# Patient Record
Sex: Male | Born: 1965 | Race: Black or African American | Hispanic: No | Marital: Single | Smoking: Never smoker
Health system: Southern US, Community
[De-identification: ages and names within clinical notes are randomized; demographics above are authoritative.]

## PROBLEM LIST (undated history)

## (undated) DIAGNOSIS — I10 Essential (primary) hypertension: Secondary | ICD-10-CM

## (undated) DIAGNOSIS — E119 Type 2 diabetes mellitus without complications: Secondary | ICD-10-CM

## (undated) DIAGNOSIS — B54 Unspecified malaria: Secondary | ICD-10-CM

## (undated) DIAGNOSIS — K4090 Unilateral inguinal hernia, without obstruction or gangrene, not specified as recurrent: Secondary | ICD-10-CM

## (undated) HISTORY — DX: Unilateral inguinal hernia, without obstruction or gangrene, not specified as recurrent: K40.90

## (undated) HISTORY — DX: Type 2 diabetes mellitus without complications: E11.9

## (undated) HISTORY — DX: Unspecified malaria: B54

---

## 2000-04-07 ENCOUNTER — Emergency Department (HOSPITAL_COMMUNITY): Admission: EM | Admit: 2000-04-07 | Discharge: 2000-04-07 | Payer: Self-pay | Admitting: *Deleted

## 2005-10-17 ENCOUNTER — Ambulatory Visit: Payer: Self-pay | Admitting: Internal Medicine

## 2005-10-17 ENCOUNTER — Ambulatory Visit (HOSPITAL_COMMUNITY): Admission: RE | Admit: 2005-10-17 | Discharge: 2005-10-17 | Payer: Self-pay | Admitting: Internal Medicine

## 2005-10-25 ENCOUNTER — Ambulatory Visit: Payer: Self-pay | Admitting: Internal Medicine

## 2005-11-01 ENCOUNTER — Ambulatory Visit: Payer: Self-pay | Admitting: Internal Medicine

## 2005-11-06 ENCOUNTER — Ambulatory Visit: Payer: Self-pay

## 2005-11-06 ENCOUNTER — Encounter: Payer: Self-pay | Admitting: Cardiology

## 2005-12-06 ENCOUNTER — Ambulatory Visit: Payer: Self-pay | Admitting: Internal Medicine

## 2005-12-11 ENCOUNTER — Ambulatory Visit: Payer: Self-pay | Admitting: Internal Medicine

## 2006-01-06 ENCOUNTER — Ambulatory Visit: Payer: Self-pay | Admitting: Internal Medicine

## 2006-04-10 ENCOUNTER — Ambulatory Visit: Payer: Self-pay | Admitting: Internal Medicine

## 2006-04-21 ENCOUNTER — Ambulatory Visit: Payer: Self-pay | Admitting: Internal Medicine

## 2006-05-12 ENCOUNTER — Ambulatory Visit: Payer: Self-pay | Admitting: Internal Medicine

## 2006-06-17 ENCOUNTER — Ambulatory Visit: Payer: Self-pay | Admitting: Internal Medicine

## 2006-09-05 ENCOUNTER — Ambulatory Visit: Payer: Self-pay | Admitting: Internal Medicine

## 2007-02-27 DIAGNOSIS — M545 Low back pain: Secondary | ICD-10-CM

## 2007-02-27 DIAGNOSIS — I10 Essential (primary) hypertension: Secondary | ICD-10-CM

## 2007-04-11 ENCOUNTER — Ambulatory Visit: Payer: Self-pay | Admitting: Family Medicine

## 2007-05-20 ENCOUNTER — Ambulatory Visit: Payer: Self-pay | Admitting: Internal Medicine

## 2007-07-13 ENCOUNTER — Ambulatory Visit: Payer: Self-pay | Admitting: Internal Medicine

## 2007-07-20 LAB — CONVERTED CEMR LAB
BUN: 13 mg/dL (ref 6–23)
CO2: 33 meq/L — ABNORMAL HIGH (ref 19–32)
Calcium: 9.5 mg/dL (ref 8.4–10.5)
Chloride: 100 meq/L (ref 96–112)
Creatinine, Ser: 1.1 mg/dL (ref 0.4–1.5)
GFR calc Af Amer: 96 mL/min
GFR calc non Af Amer: 79 mL/min
Glucose, Bld: 122 mg/dL — ABNORMAL HIGH (ref 70–99)
Potassium: 4.1 meq/L (ref 3.5–5.1)
Sodium: 137 meq/L (ref 135–145)

## 2007-09-24 ENCOUNTER — Ambulatory Visit: Payer: Self-pay | Admitting: Endocrinology

## 2007-09-24 DIAGNOSIS — M549 Dorsalgia, unspecified: Secondary | ICD-10-CM | POA: Insufficient documentation

## 2007-09-25 ENCOUNTER — Ambulatory Visit: Payer: Self-pay | Admitting: Endocrinology

## 2007-09-28 LAB — CONVERTED CEMR LAB: Sed Rate: 8 mm/hr (ref 0–20)

## 2007-11-12 ENCOUNTER — Encounter: Payer: Self-pay | Admitting: Internal Medicine

## 2008-02-15 ENCOUNTER — Ambulatory Visit: Payer: Self-pay | Admitting: Endocrinology

## 2008-02-15 DIAGNOSIS — R112 Nausea with vomiting, unspecified: Secondary | ICD-10-CM

## 2008-02-15 DIAGNOSIS — B9789 Other viral agents as the cause of diseases classified elsewhere: Secondary | ICD-10-CM | POA: Insufficient documentation

## 2008-02-15 DIAGNOSIS — R079 Chest pain, unspecified: Secondary | ICD-10-CM

## 2010-09-02 LAB — CONVERTED CEMR LAB
BUN: 13 mg/dL (ref 6–23)
Basophils Absolute: 0.1 10*3/uL (ref 0.0–0.1)
Basophils Relative: 0.7 % (ref 0.0–1.0)
CO2: 27 meq/L (ref 19–32)
Calcium: 8.7 mg/dL (ref 8.4–10.5)
Chloride: 103 meq/L (ref 96–112)
Creatinine, Ser: 1.1 mg/dL (ref 0.4–1.5)
Eosinophils Absolute: 0 10*3/uL (ref 0.0–0.7)
Eosinophils Relative: 0.2 % (ref 0.0–5.0)
GFR calc Af Amer: 96 mL/min
GFR calc non Af Amer: 79 mL/min
Glucose, Bld: 93 mg/dL (ref 70–99)
HCT: 43.7 % (ref 39.0–52.0)
Hemoglobin: 15.3 g/dL (ref 13.0–17.0)
Lymphocytes Relative: 13.5 % (ref 12.0–46.0)
MCHC: 34.9 g/dL (ref 30.0–36.0)
MCV: 92.2 fL (ref 78.0–100.0)
Monocytes Absolute: 0.5 10*3/uL (ref 0.1–1.0)
Monocytes Relative: 6.6 % (ref 3.0–12.0)
Neutro Abs: 6.2 10*3/uL (ref 1.4–7.7)
Neutrophils Relative %: 79 % — ABNORMAL HIGH (ref 43.0–77.0)
Platelets: 169 10*3/uL (ref 150–400)
Potassium: 3.4 meq/L — ABNORMAL LOW (ref 3.5–5.1)
RBC: 4.75 M/uL (ref 4.22–5.81)
RDW: 12.1 % (ref 11.5–14.6)
Sodium: 137 meq/L (ref 135–145)
WBC: 7.9 10*3/uL (ref 4.5–10.5)

## 2012-07-13 ENCOUNTER — Encounter (HOSPITAL_COMMUNITY): Payer: Self-pay | Admitting: *Deleted

## 2012-07-13 ENCOUNTER — Emergency Department (HOSPITAL_COMMUNITY): Payer: Managed Care, Other (non HMO)

## 2012-07-13 ENCOUNTER — Emergency Department (HOSPITAL_COMMUNITY)
Admission: EM | Admit: 2012-07-13 | Discharge: 2012-07-13 | Disposition: A | Discharge status: Home / Self Care | Payer: Managed Care, Other (non HMO) | Attending: Emergency Medicine | Admitting: Emergency Medicine

## 2012-07-13 DIAGNOSIS — I10 Essential (primary) hypertension: Secondary | ICD-10-CM

## 2012-07-13 HISTORY — DX: Essential (primary) hypertension: I10

## 2012-07-13 LAB — BASIC METABOLIC PANEL
CO2: 31 mEq/L (ref 19–32)
Calcium: 9.4 mg/dL (ref 8.4–10.5)
Glucose, Bld: 81 mg/dL (ref 70–99)
Sodium: 143 mEq/L (ref 135–145)

## 2012-07-13 LAB — URINE MICROSCOPIC-ADD ON

## 2012-07-13 LAB — POCT I-STAT TROPONIN I: Troponin i, poc: 0 ng/mL (ref 0.00–0.08)

## 2012-07-13 LAB — URINALYSIS, ROUTINE W REFLEX MICROSCOPIC
Glucose, UA: NEGATIVE mg/dL
Hgb urine dipstick: NEGATIVE
Protein, ur: 30 mg/dL — AB
Specific Gravity, Urine: 1.017 (ref 1.005–1.030)
pH: 7.5 (ref 5.0–8.0)

## 2012-07-13 MED ORDER — AMLODIPINE BESYLATE 5 MG PO TABS
5.0000 mg | ORAL_TABLET | Freq: Once | ORAL | Status: AC
  Administered 2012-07-13: 5 mg via ORAL
  Filled 2012-07-13: qty 1

## 2012-07-13 MED ORDER — LISINOPRIL 20 MG PO TABS: 20.0000 mg | ORAL_TABLET | Freq: Every day | ORAL | Status: DC

## 2012-07-13 MED ORDER — AMLODIPINE BESYLATE 5 MG PO TABS: 5.0000 mg | ORAL_TABLET | Freq: Every day | ORAL | Status: DC

## 2012-07-13 MED ORDER — LISINOPRIL 20 MG PO TABS
20.0000 mg | ORAL_TABLET | Freq: Once | ORAL | Status: AC
  Administered 2012-07-13: 20 mg via ORAL
  Filled 2012-07-13: qty 1

## 2012-07-13 NOTE — ED Notes (Signed)
From MD's office, seen for hypertension. Sent to ED for abnormal EKG

## 2012-07-13 NOTE — ED Provider Notes (Signed)
History     CSN: [id number]  Arrival date & time 07/13/12  1556   First MD Initiated Contact with Patient 07/13/12 1557      Chief Complaint  Patient presents with  . Hypertension    (Consider location/radiation/quality/duration/timing/severity/associated sxs/prior treatment) HPI Comments: Patient at his friend's house and his friend said, "You don't look good, you look pale." Patient presented to the urgent care for this. He denied any pain or malaise. EKG there with concern for ST elevation in V2/V3.   Patient is a 46 y.o. male presenting with general illness. The history is provided by the patient.  Illness  The current episode started today. The onset was sudden. The problem occurs continuously. The problem has been unchanged. The problem is mild. Nothing relieves the symptoms. Nothing aggravates the symptoms. Pertinent negatives include no fever, no abdominal pain, no vomiting and no cough.    Past Medical History  Diagnosis Date  . Hypertension     History reviewed. No pertinent past surgical history.  No family history on file.  History  Substance Use Topics  . Smoking status: Not on file  . Smokeless tobacco: Not on file  . Alcohol Use:       Review of Systems  Constitutional: Negative for fever and chills.  Respiratory: Negative for cough and shortness of breath.   Cardiovascular: Negative for chest pain and leg swelling.  Gastrointestinal: Negative for vomiting and abdominal pain.  All other systems reviewed and are negative.    Allergies  Chloroquine phosphate  Home Medications  No current outpatient prescriptions on file.  BP 171/105  Pulse 58  Temp 97.8 F (36.6 C) (Oral)  Resp 18  SpO2 100%  Physical Exam  Nursing note and vitals reviewed. Constitutional: He is oriented to person, place, and time. He appears well-developed and well-nourished. No distress.  HENT:  Head: Normocephalic and atraumatic.  Mouth/Throat: No oropharyngeal  exudate.  Eyes: EOM are normal. Pupils are equal, round, and reactive to light.  Neck: Normal range of motion. Neck supple.  Cardiovascular: Normal rate and regular rhythm.  Exam reveals no friction rub.   No murmur heard. Pulmonary/Chest: Effort normal and breath sounds normal. No respiratory distress. He has no wheezes. He has no rales.  Abdominal: He exhibits no distension. There is no tenderness. There is no rebound.  Musculoskeletal: Normal range of motion. He exhibits no edema.  Neurological: He is alert and oriented to person, place, and time.  Skin: Skin is dry. No rash noted. He is not diaphoretic.    ED Course  Procedures (including critical care time)  Labs Reviewed  BASIC METABOLIC PANEL - Abnormal; Notable for the following:    GFR calc non Af Amer 82 (*)     All other components within normal limits  URINALYSIS, ROUTINE W REFLEX MICROSCOPIC - Abnormal; Notable for the following:    Protein, ur 30 (*)     All other components within normal limits  POCT I-STAT TROPONIN I  URINE MICROSCOPIC-ADD ON   Dg Chest 2 View  07/13/2012  *RADIOLOGY REPORT*  Clinical Data: Hypertension, headache, dizziness, URI last week  CHEST - 2 VIEW  Comparison: 09/25/2007  Findings: Normal heart size, mediastinal contours, and pulmonary vascularity. Lungs clear. No pleural effusion or pneumothorax. Scattered endplate spur formation thoracic spine.  IMPRESSION: No acute abnormalities.   Original Report Authenticated By: Ulyses Southward, M.D.      1. Hypertension      Date: 07/13/2012  Rate: 56  Rhythm: normal sinus rhythm  QRS Axis: normal  Intervals: normal  ST/T Wave abnormalities: early repolarization  Conduction Disutrbances:none  Narrative Interpretation: NO STEMI. Concern for LVH.   Old EKG Reviewed: none available   MDM   Patient is a 46 year old male with history of hypertension and is noncompliant with medications. Patient was at his friend's house today and his friend noted that  he looked pale. He went to urgent care where an EKG showed possible ST elevation in V2 and V3. He was sent here for further evaluation. On arrival, patient is hypertensive at 185/121. He is mildly ready cardiac in the high 50s. He has no complaints of chest pain, shortness of breath, headache, blurry vision, neurologic symptoms. He states he feels very well today. On patient EKG is noted to have benign early repolarization that appears to look like ST elevation in V2 and V3. He doesn't have a reciprocal changes. I do not feel patient is having a STEMI. He is completely asymptomatic and feels well.l. I will check basic labs to see if he has any end organ damage from his prolonged hypertension. Labs show some mild proteinuria. He has normal renal function on his BMP. Chest x-ray is normal, reviewed by me. Patient is stable for discharge since his pressures are improving. His systolic has come down over 20 points and his diastolics come down over 15 points from the initial EMS pressure of 190/130. Will give prescription for Norvasc and lisinopril and give PCP followup.        Elwin Mocha, MD 07/13/12 [phone]

## 2012-07-14 NOTE — ED Provider Notes (Signed)
I saw and evaluated the patient, reviewed the resident's note and I agree with the findings and plan.  Sent from urgent care with hypertension and abnormal EKG.  Feels well. No headache, vision change, chest pain, SOB, nausea, vomiting. Noncompliant with BP meds. EKG with early repolarization and LVH. No evidence of end organ damage.  Glynn Octave, MD 07/14/12 1323

## 2012-07-16 ENCOUNTER — Ambulatory Visit (INDEPENDENT_AMBULATORY_CARE_PROVIDER_SITE_OTHER): Payer: Managed Care, Other (non HMO) | Admitting: Internal Medicine

## 2012-07-16 ENCOUNTER — Encounter: Payer: Self-pay | Admitting: Internal Medicine

## 2012-07-16 VITALS — BP 158/108 | HR 86 | Temp 98.8°F | Ht 66.5 in | Wt 188.0 lb

## 2012-07-16 DIAGNOSIS — B360 Pityriasis versicolor: Secondary | ICD-10-CM

## 2012-07-16 DIAGNOSIS — I10 Essential (primary) hypertension: Secondary | ICD-10-CM

## 2012-07-16 MED ORDER — FLUCONAZOLE 150 MG PO TABS: ORAL_TABLET | ORAL | Status: DC

## 2012-07-16 MED ORDER — LISINOPRIL 20 MG PO TABS: 20.0000 mg | ORAL_TABLET | Freq: Every day | ORAL | Status: DC

## 2012-07-16 MED ORDER — AMLODIPINE BESYLATE 5 MG PO TABS: 5.0000 mg | ORAL_TABLET | Freq: Every day | ORAL | Status: DC

## 2012-07-16 NOTE — Patient Instructions (Addendum)
Please complete the following lab tests before your next follow up appointment: BMET, FLP, FLTs, TSH - 401.9 Use Selson blue shampoo with Selenium sulfide as body wash daily as directed for 2-4 weeks.

## 2012-07-16 NOTE — Assessment & Plan Note (Signed)
Patient has tinea versicolor on chest,shoulders and upper back. Treat with fluconazole 300 mg once weekly for 2 weeks. Patient also advised to use [facility] as body wash for 2-4 weeks.

## 2012-07-16 NOTE — Progress Notes (Signed)
Subjective:    Patient ID: Devon Walsh, male    DOB: 1965-08-24, 46 y.o.   MRN: [id number]  HPI  46 year old African male for followup from ER visit. 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 Patient has history of hypertension but stopped taking medications in July of 2012. Patient reports previously he experienced headaches with elevated blood pressure but he was feeling fairly normal before ER visit. His friend noticed that "he did not look right". His friend urged patient to seek urgent medical attention. He was seen in urgent care and there was concern for EKG changes. Patient noted to have ST elevation in V2 and V3.  Patient was not experiencing any chest discomfort. His cardiac enzyme was normal. Chest x-ray was also normal.  He was restarted on lisinopril 20 mg once daily and amlodipine 5 mg once daily. It has been only 2-3 days since starting medication.  Patient also complains of diffuse rash that started the summer. He has flat hypopigmented areas on his chest shoulders and back.   Review of Systems  Constitutional: Negative for activity change, appetite change and unexpected weight change.  Eyes: Negative for visual disturbance.  Respiratory: Negative for cough, chest tightness and shortness of breath.   Cardiovascular: Negative for chest pain.  Genitourinary: Negative for difficulty urinating.  Neurological: Negative for headaches.  Gastrointestinal: Negative for abdominal pain, heartburn melena or hematochezia Psych: Negative for depression or anxiety  Past Medical History  Diagnosis Date  . Hypertension     History   Social History  . Marital Status: Single    Spouse Name: N/A    Number of Children: N/A  . Years of Education: N/A   Occupational History  . Not on file.   Social History Main Topics  . Smoking status: Never Smoker   . Smokeless tobacco: Never Used  . Alcohol Use: No  . Drug Use: No  . Sexually Active: Not on file   Other  Topics Concern  . Not on file   Social History Narrative  . No narrative on file    History reviewed. No pertinent past surgical history.  Family History  Problem Relation Age of Onset  . Hypertension      Allergies  Allergen Reactions  . Chloroquine Phosphate     REACTION: Itching    Current Outpatient Prescriptions on File Prior to Visit  Medication Sig Dispense Refill  . [DISCONTINUED] amLODipine (NORVASC) 5 MG tablet Take 1 tablet (5 mg total) by mouth daily.  30 tablet  0  . [DISCONTINUED] lisinopril (PRINIVIL,ZESTRIL) 20 MG tablet Take 1 tablet (20 mg total) by mouth daily.  30 tablet  0    BP 158/108  Pulse 86  Temp 98.8 F (37.1 C) (Oral)  Ht 5' 6.5" (1.689 m)  Wt 188 lb (85.276 kg)  BMI 29.89 kg/m2           Objective:   Physical Exam  Constitutional: He is oriented to person, place, and time. He appears well-developed and well-nourished.  HENT:  Head: Normocephalic and atraumatic.  Cardiovascular: Normal rate, regular rhythm and normal heart sounds.   Pulmonary/Chest: Effort normal and breath sounds normal. He has no wheezes.  Abdominal: Soft. Bowel sounds are normal. There is no tenderness.  Musculoskeletal: He exhibits no edema.  Neurological: He is alert and oriented to person, place, and time. No cranial nerve deficit.  Skin: Skin is warm and dry. Rash noted.       Scattered hypopigmented areas on back,  shoulders and chest  Psychiatric: He has a normal mood and affect. His behavior is normal.          Assessment & Plan:

## 2012-07-16 NOTE — Assessment & Plan Note (Signed)
46 year old African male with malignant hypertension. We discussed at length the risks of untreated hypertension including stroke and myocardial infarction.  I urged compliance with blood pressure medications. Continue lisinopril 20 mg and amlodipine 5 mg for now. It is too early to make dosage adjustments. Reassess patient in one month. Obtain electrolytes and kidney function before next office visit. Patient warned against using over-the-counter NSAIDs with lisinopril. Patient to use Tylenol for headaches.  Obtain screening lipid panel for additional risk stratification.  BP: 158/108 mmHg

## 2012-11-21 ENCOUNTER — Other Ambulatory Visit: Payer: Self-pay | Admitting: Internal Medicine

## 2013-03-01 ENCOUNTER — Other Ambulatory Visit: Payer: Self-pay | Admitting: Internal Medicine

## 2013-03-30 ENCOUNTER — Encounter: Payer: Self-pay | Admitting: Internal Medicine

## 2013-03-30 ENCOUNTER — Ambulatory Visit (INDEPENDENT_AMBULATORY_CARE_PROVIDER_SITE_OTHER): Payer: Managed Care, Other (non HMO) | Admitting: Internal Medicine

## 2013-03-30 VITALS — BP 162/90 | HR 72 | Temp 98.0°F | Wt 190.0 lb

## 2013-03-30 DIAGNOSIS — I10 Essential (primary) hypertension: Secondary | ICD-10-CM

## 2013-03-30 DIAGNOSIS — B360 Pityriasis versicolor: Secondary | ICD-10-CM

## 2013-03-30 DIAGNOSIS — R358 Other polyuria: Secondary | ICD-10-CM

## 2013-03-30 DIAGNOSIS — R3589 Other polyuria: Secondary | ICD-10-CM | POA: Insufficient documentation

## 2013-03-30 LAB — POCT URINALYSIS DIPSTICK
Blood, UA: NEGATIVE
Glucose, UA: NEGATIVE
Nitrite, UA: NEGATIVE
Spec Grav, UA: 1.02
Urobilinogen, UA: 0.2

## 2013-03-30 MED ORDER — LISINOPRIL 20 MG PO TABS: 20.0000 mg | ORAL_TABLET | Freq: Every day | ORAL | Status: DC

## 2013-03-30 MED ORDER — AMLODIPINE BESYLATE 10 MG PO TABS: 10.0000 mg | ORAL_TABLET | Freq: Every day | ORAL | Status: DC

## 2013-03-30 NOTE — Assessment & Plan Note (Addendum)
Patient has difficult to control hypertension despite using lisinopril 20 mg amlodipine 5 mg. Increase amlodipine to 10 mg. Patient trying to decrease his sodium intake. If persistent elevation, consider workup for secondary hypertension including renal arterial doppler. BP: 162/90 mmHg  Monitor electrolytes, kidney function, TSH, FLP and LFTs.

## 2013-03-30 NOTE — Patient Instructions (Addendum)
Use Selsun blue shampoo as body wash daily for 1 month

## 2013-03-30 NOTE — Assessment & Plan Note (Signed)
Patient complains of intermittent pruritus. He still has tinea versicolor. He's planning to travel back to his home country[place] within the next one month. He will need malaria prophylaxis. Use over-the-counter Selsun Blue as body wash. If persistent symptoms, we discussed using Diflucan.

## 2013-03-30 NOTE — Progress Notes (Signed)
  Subjective:    Patient ID: Devon Walsh, male    DOB: 1966/06/20, 47 y.o.   MRN: [id number]  HPI  47 year old African American male for followup regarding: Controlled hypertension. Patient reports she's been compliant with his antihypertensives. He is currently taking lisinopril 20 mg once daily and amlodipine 5 mg once daily. Despite taking blood pressure medications as readings at home are persistently in the 150s to 160s systolic.  Patient also describes intermittent polyuria. His symptoms are mainly in the morning. He also has 2-3 episodes of nocturia. He reports drinking 1 to 2 L in the evening.   Review of Systems No chest pain or shortness of breath.  Patient plays soccer recreationally without any difficulty    Past Medical History  Diagnosis Date  . Hypertension     History   Social History  . Marital Status: Single    Spouse Name: N/A    Number of Children: N/A  . Years of Education: N/A   Occupational History  . Not on file.   Social History Main Topics  . Smoking status: Never Smoker   . Smokeless tobacco: Never Used  . Alcohol Use: No  . Drug Use: No  . Sexual Activity: Not on file   Other Topics Concern  . Not on file   Social History Narrative  . No narrative on file    No past surgical history on file.  Family History  Problem Relation Age of Onset  . Hypertension      Allergies  Allergen Reactions  . Chloroquine Phosphate     REACTION: Itching    No current outpatient prescriptions on file prior to visit.   No current facility-administered medications on file prior to visit.    BP 162/90  Pulse 72  Temp(Src) 98 F (36.7 C) (Oral)  Wt 190 lb (86.183 kg)  BMI 30.21 kg/m2    Objective:   Physical Exam  Constitutional: He appears well-developed and well-nourished.  HENT:  Head: Normocephalic and atraumatic.  Eyes: EOM are normal. Pupils are equal, round, and reactive to light.  Neck: Neck supple.  Cardiovascular: Normal rate,  regular rhythm and normal heart sounds.   No murmur heard. Pulmonary/Chest: Effort normal and breath sounds normal. He has no wheezes.  Musculoskeletal: He exhibits no edema.  Lymphadenopathy:    He has no cervical adenopathy.  Neurological: He is alert. No cranial nerve deficit.  Skin: Skin is warm and dry.  Areas of hypopigmentation on back and upper arms  Psychiatric: He has a normal mood and affect. His behavior is normal.          Assessment & Plan:

## 2013-03-30 NOTE — Assessment & Plan Note (Signed)
I doubt symptoms secondary to hyperglycemia. He does not have family history of type 2 diabetes. Check A1c. Check urinalysis.

## 2013-03-31 LAB — CBC WITH DIFFERENTIAL/PLATELET
Basophils Relative: 0.6 % (ref 0.0–3.0)
Hemoglobin: 14.5 g/dL (ref 13.0–17.0)
Lymphocytes Relative: 40.9 % (ref 12.0–46.0)
Monocytes Relative: 6.2 % (ref 3.0–12.0)
Neutro Abs: 2.6 10*3/uL (ref 1.4–7.7)
Neutrophils Relative %: 51.5 % (ref 43.0–77.0)
RBC: 4.66 Mil/uL (ref 4.22–5.81)
WBC: 5.1 10*3/uL (ref 4.5–10.5)

## 2013-03-31 LAB — HEMOGLOBIN A1C: Hgb A1c MFr Bld: 6 % (ref 4.6–6.5)

## 2013-04-01 LAB — LIPID PANEL
Cholesterol: 146 mg/dL (ref 0–200)
HDL: 39.5 mg/dL (ref >39.00)
LDL Cholesterol: 86 mg/dL (ref 0–99)
Total CHOL/HDL Ratio: 4
Triglycerides: 101 mg/dL (ref 0.0–149.0)

## 2013-04-01 LAB — BASIC METABOLIC PANEL
Calcium: 9.3 mg/dL (ref 8.4–10.5)
GFR: 94.12 mL/min (ref >60.00)
Sodium: 138 mEq/L (ref 135–145)

## 2013-04-01 LAB — HEPATIC FUNCTION PANEL
AST: 26 U/L (ref 0–37)
Albumin: 4.2 g/dL (ref 3.5–5.2)
Total Protein: 7.5 g/dL (ref 6.0–8.3)

## 2013-04-19 ENCOUNTER — Encounter: Payer: Self-pay | Admitting: *Deleted

## 2013-04-19 ENCOUNTER — Telehealth: Payer: Self-pay | Admitting: *Deleted

## 2013-04-19 DIAGNOSIS — I10 Essential (primary) hypertension: Secondary | ICD-10-CM

## 2013-04-19 DIAGNOSIS — E785 Hyperlipidemia, unspecified: Secondary | ICD-10-CM

## 2013-04-19 DIAGNOSIS — R7309 Other abnormal glucose: Secondary | ICD-10-CM

## 2013-04-19 MED ORDER — PRAVASTATIN SODIUM 40 MG PO TABS: 40.0000 mg | ORAL_TABLET | Freq: Every day | ORAL | Status: DC

## 2013-04-19 NOTE — Telephone Encounter (Signed)
Lab results mailed to home address, rx sent to pharmacy, and labs ordered

## 2013-04-19 NOTE — Telephone Encounter (Signed)
Message copied by Trenton Gammon on Mon Apr 19, 2013  3:34 PM ------      Message from: Meda Coffee      Created: Fri Apr 09, 2013 10:31 AM       Call pt - electrolytes and kidney function stable.  CBCD and TSH normal.  A1c shows he is pre diabetic.  Although lipid panel is normal, his 10 year cardiovascular risk is elevated at 9.7%.  I suggest he add pravastatin 40 mg #90 one po qd RF x 1.  He needs repeat OV within 2 months.  Labs before OV - BMET - 401.9, FLP, LFTs - 272.4, A1c - 790.29.            He should follow low carb diet and avoid concentrated sweets ------

## 2013-04-22 ENCOUNTER — Encounter: Payer: Self-pay | Admitting: *Deleted

## 2013-05-17 ENCOUNTER — Encounter: Payer: Self-pay | Admitting: Internal Medicine

## 2013-05-17 ENCOUNTER — Ambulatory Visit (INDEPENDENT_AMBULATORY_CARE_PROVIDER_SITE_OTHER): Payer: Managed Care, Other (non HMO) | Admitting: Internal Medicine

## 2013-05-17 VITALS — BP 122/84 | Temp 98.1°F | Wt 183.0 lb

## 2013-05-17 DIAGNOSIS — I10 Essential (primary) hypertension: Secondary | ICD-10-CM

## 2013-05-17 DIAGNOSIS — M7521 Bicipital tendinitis, right shoulder: Secondary | ICD-10-CM | POA: Insufficient documentation

## 2013-05-17 DIAGNOSIS — E785 Hyperlipidemia, unspecified: Secondary | ICD-10-CM

## 2013-05-17 DIAGNOSIS — M7520 Bicipital tendinitis, unspecified shoulder: Secondary | ICD-10-CM

## 2013-05-17 MED ORDER — DICLOFENAC SODIUM 1 % TD GEL: 2.0000 g | Freq: Three times a day (TID) | TRANSDERMAL | Status: DC

## 2013-05-17 NOTE — Progress Notes (Signed)
  Subjective:    Patient ID: Devon Walsh, male    DOB: [birth year], 47 y.o.   MRN: [id number]  HPI  47 year old African male with history of hypertension and hyperlipidemia for followup. Patient reports good medication compliance. His home blood pressure readings have been normal. He is tolerating pravastatin without any side effects. He has improved his diet. He recently gave up his second job and plans to restart exercise program.  Patient complains of pain in his right bicep. 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  He denies any other trauma. He has not tried any over-the-counter analgesics.   Review of Systems Negative for chest pain, negative for dizziness    Past Medical History  Diagnosis Date  . Hypertension     History   Social History  . Marital Status: Single    Spouse Name: N/A    Number of Children: N/A  . Years of Education: N/A   Occupational History  . Not on file.   Social History Main Topics  . Smoking status: Never Smoker   . Smokeless tobacco: Never Used  . Alcohol Use: No  . Drug Use: No  . Sexual Activity: Not on file   Other Topics Concern  . Not on file   Social History Narrative  . No narrative on file    No past surgical history on file.  Family History  Problem Relation Age of Onset  . Hypertension      Allergies  Allergen Reactions  . Chloroquine Phosphate     REACTION: Itching    Current Outpatient Prescriptions on File Prior to Visit  Medication Sig Dispense Refill  . amLODipine (NORVASC) 10 MG tablet Take 1 tablet (10 mg total) by mouth daily.  90 tablet  1  . lisinopril (PRINIVIL,ZESTRIL) 20 MG tablet Take 1 tablet (20 mg total) by mouth daily.  90 tablet  1  . pravastatin (PRAVACHOL) 40 MG tablet Take 1 tablet (40 mg total) by mouth daily.  90 tablet  1   No current facility-administered medications on file prior to visit.    BP 122/84  Temp(Src) 98.1 F (36.7 C) (Oral)  Wt 183 lb (83.008 kg)  BMI 29.1  kg/m2    Objective:   Physical Exam  Constitutional: He is oriented to person, place, and time. He appears well-developed and well-nourished.  HENT:  Head: Normocephalic and atraumatic.  Neck: Neck supple.  No carotid bruit  Cardiovascular: Normal rate, regular rhythm and normal heart sounds.   No murmur heard. Pulmonary/Chest: Effort normal and breath sounds normal. He has no wheezes.  Musculoskeletal: He exhibits no edema.  Tenderness of right bicep tendon near elbow  Neurological: He is alert and oriented to person, place, and time. No cranial nerve deficit.  Psychiatric: He has a normal mood and affect. His behavior is normal.          Assessment & Plan:

## 2013-05-17 NOTE — Assessment & Plan Note (Signed)
Patient has symptoms of right bicep tendinitis. His symptoms secondary to recurrent lifting at work. Patient advised to avoid overuse. Use Voltaren gel 3 times a day as directed.  Also try using neoprene elbow brace.  Patient advised to call office if symptoms persist or worsen.  If no improvement in symptoms, he may need "light duty" for 1-2 weeks.

## 2013-05-17 NOTE — Patient Instructions (Signed)
Monitor your blood pressure at home Please complete the following lab tests before your next follow up appointment: BMET - 401.9 Apply ice, rest and use elbow neoprene brace as directed. Please contact our office if your right arm pain does not improve or gets worse.

## 2013-05-17 NOTE — Assessment & Plan Note (Signed)
Patient tolerating pravastatin. Continue same dose.

## 2013-05-17 NOTE — Assessment & Plan Note (Signed)
Good response to higher dose of amlodipine.  Continue same dose.  BP: 122/84 mmHg  Lab Results  Component Value Date   NA 138 03/30/2013   K 3.8 03/30/2013   CL 102 03/30/2013   CO2 29 03/30/2013   Lab Results  Component Value Date   CREATININE 1.1 03/30/2013   Continue pravastatin for CV prevention.   Lab Results  Component Value Date   CHOL 146 03/30/2013   HDL 39.50 03/30/2013   LDLCALC 86 03/30/2013   TRIG 101.0 03/30/2013   CHOLHDL 4 03/30/2013   Lab Results  Component Value Date   ALT 22 03/30/2013   AST 26 03/30/2013   ALKPHOS 86 03/30/2013   BILITOT 0.5 03/30/2013

## 2013-06-03 IMAGING — CR DG CHEST 2V
2 series · 2 of 2 positions shown · non-contrast
Comparison: 09/25/2007

CLINICAL DATA: Hypertension, headache, dizziness, URI last week

CHEST - 2 VIEW

[w chest pa]
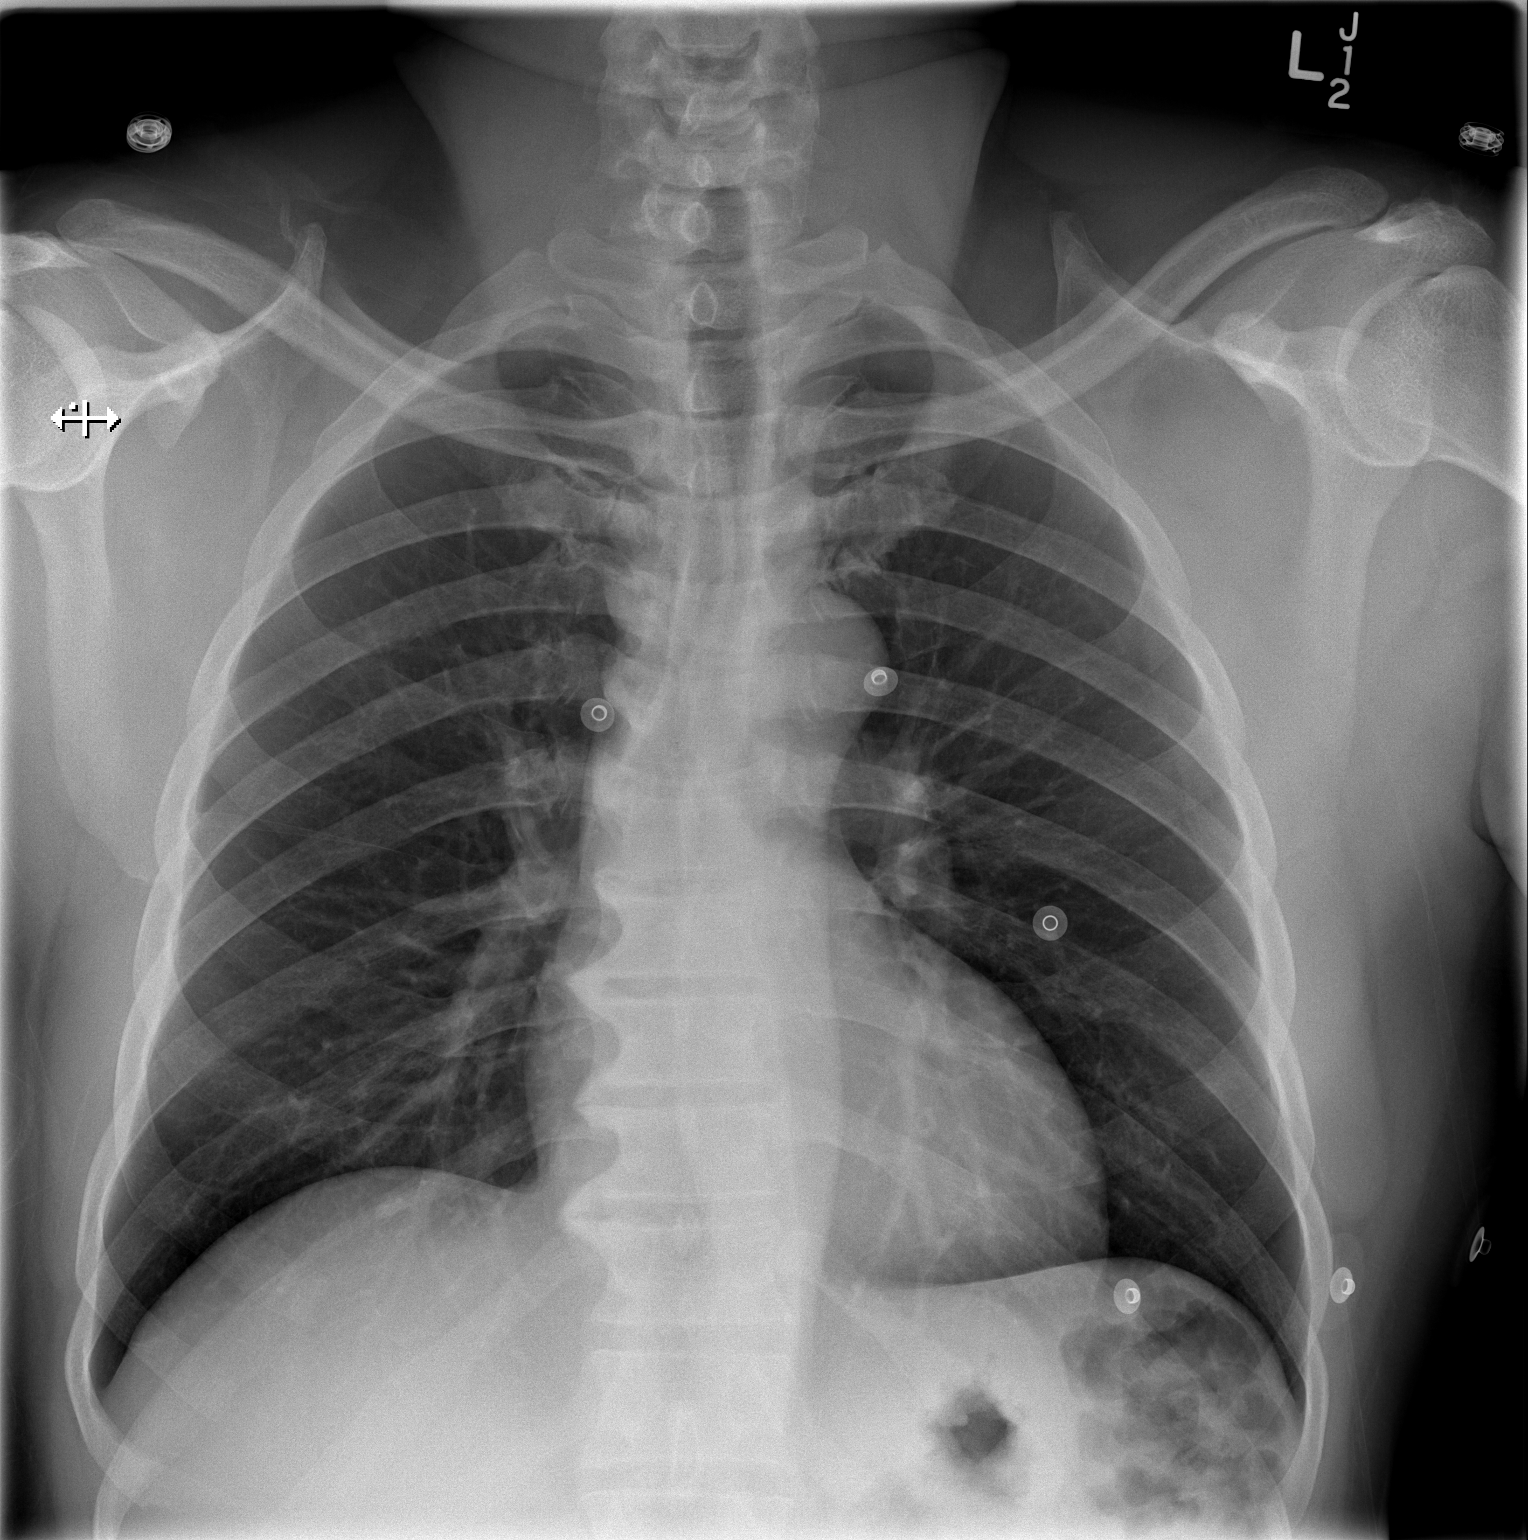

[w chest lat]
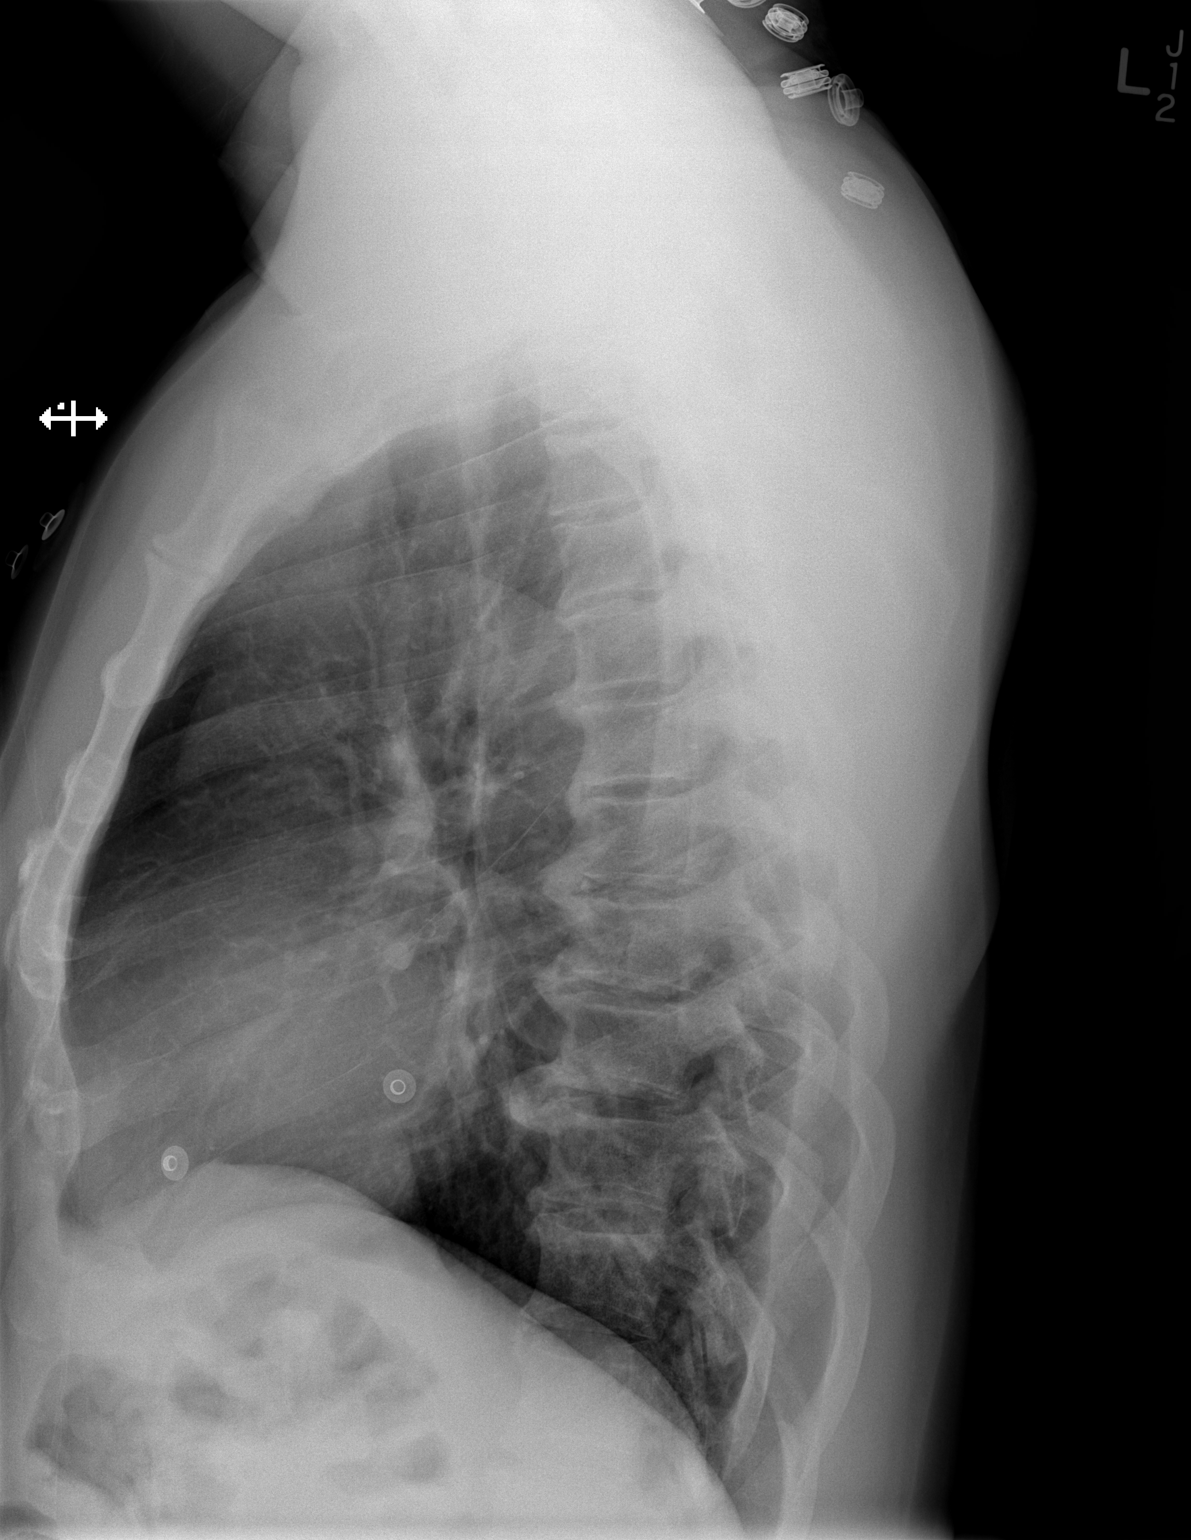

[2 of 2 positions shown; findings below may reference images not displayed]

FINDINGS: Normal heart size, mediastinal contours, and pulmonary vascularity.
Lungs clear.
No pleural effusion or pneumothorax.
Scattered endplate spur formation thoracic spine.
IMPRESSION: No acute abnormalities.

## 2013-06-04 ENCOUNTER — Other Ambulatory Visit: Payer: Managed Care, Other (non HMO)

## 2013-06-08 ENCOUNTER — Other Ambulatory Visit (INDEPENDENT_AMBULATORY_CARE_PROVIDER_SITE_OTHER): Payer: Managed Care, Other (non HMO)

## 2013-06-08 DIAGNOSIS — I10 Essential (primary) hypertension: Secondary | ICD-10-CM

## 2013-06-08 DIAGNOSIS — R7309 Other abnormal glucose: Secondary | ICD-10-CM

## 2013-06-08 DIAGNOSIS — E785 Hyperlipidemia, unspecified: Secondary | ICD-10-CM

## 2013-06-08 LAB — BASIC METABOLIC PANEL
BUN: 18 mg/dL (ref 6–23)
Calcium: 9.6 mg/dL (ref 8.4–10.5)
Creatinine, Ser: 1.3 mg/dL (ref 0.4–1.5)
GFR: 76.74 mL/min (ref >60.00)
Glucose, Bld: 80 mg/dL (ref 70–99)
Potassium: 3.9 mEq/L (ref 3.5–5.1)

## 2013-06-08 LAB — HEPATIC FUNCTION PANEL
ALT: 19 U/L (ref 0–53)
AST: 19 U/L (ref 0–37)
Alkaline Phosphatase: 98 U/L (ref 39–117)
Bilirubin, Direct: 0 mg/dL (ref 0.0–0.3)
Total Bilirubin: 0.4 mg/dL (ref 0.3–1.2)
Total Protein: 7.7 g/dL (ref 6.0–8.3)

## 2013-06-08 LAB — LIPID PANEL
Cholesterol: 129 mg/dL (ref 0–200)
VLDL: 11.8 mg/dL (ref 0.0–40.0)

## 2013-06-10 NOTE — Progress Notes (Signed)
Quick Note:  Left a message for return call. ______ 

## 2013-06-11 ENCOUNTER — Encounter: Payer: Self-pay | Admitting: Internal Medicine

## 2013-06-11 ENCOUNTER — Ambulatory Visit (INDEPENDENT_AMBULATORY_CARE_PROVIDER_SITE_OTHER): Payer: Managed Care, Other (non HMO) | Admitting: Internal Medicine

## 2013-06-11 VITALS — BP 132/76 | HR 68 | Temp 99.5°F | Ht 66.5 in | Wt 186.0 lb

## 2013-06-11 DIAGNOSIS — K4090 Unilateral inguinal hernia, without obstruction or gangrene, not specified as recurrent: Secondary | ICD-10-CM

## 2013-06-11 DIAGNOSIS — N289 Disorder of kidney and ureter, unspecified: Secondary | ICD-10-CM

## 2013-06-11 DIAGNOSIS — I10 Essential (primary) hypertension: Secondary | ICD-10-CM

## 2013-06-11 NOTE — Assessment & Plan Note (Signed)
BP stable.  No change in medication.  BP: 132/76 mmHg

## 2013-06-11 NOTE — Progress Notes (Signed)
Pre-visit discussion using our clinic review tool. No additional management support is needed unless otherwise documented below in the visit note.  

## 2013-06-11 NOTE — Patient Instructions (Signed)
Please complete the following lab tests before your next follow up appointment: BMET - 401.9 Avoid taking NSAIDs - ibuprofen, motrin, aleve, etc.  Use tylenol of headache.

## 2013-06-11 NOTE — Progress Notes (Signed)
Quick Note:  Left message for return call.   ______ 

## 2013-06-11 NOTE — Assessment & Plan Note (Signed)
Patient has mild increase in serum creatinine. This is likely secondary to NSAID use while being on ACE inhibitor. Patient advised discontinue all NSAIDs and use Tylenol as needed for headache.

## 2013-06-11 NOTE — Progress Notes (Signed)
  Subjective:    Patient ID: Devon Walsh, male    DOB: [birth year], 47 y.o.   MRN: [id number]  HPI  47 year old African male with history of hypertension for followup. His recent blood work showed mild increase in serum creatinine to 1.3. Patient denies any episodes of hypotension. He has been intermittently using ibuprofen for intermittent headaches.  He denies any dizziness or lightheadedness.  Patient also complains of bulge in his left inguinal area. It has been there for some time. He denies any pain.   Recent blood work reviewed in detail.   Review of Systems Negative for chest pain.  No urinary issues    Past Medical History  Diagnosis Date  . Hypertension     History   Social History  . Marital Status: Single    Spouse Name: N/A    Number of Children: N/A  . Years of Education: N/A   Occupational History  . Not on file.   Social History Main Topics  . Smoking status: Never Smoker   . Smokeless tobacco: Never Used  . Alcohol Use: No  . Drug Use: No  . Sexual Activity: Not on file   Other Topics Concern  . Not on file   Social History Narrative  . No narrative on file    No past surgical history on file.  Family History  Problem Relation Age of Onset  . Hypertension      Allergies  Allergen Reactions  . Chloroquine Phosphate     REACTION: Itching    Current Outpatient Prescriptions on File Prior to Visit  Medication Sig Dispense Refill  . amLODipine (NORVASC) 10 MG tablet Take 1 tablet (10 mg total) by mouth daily.  90 tablet  1  . diclofenac sodium (VOLTAREN) 1 % GEL Apply 2 g topically 3 (three) times daily.  60 g  0  . lisinopril (PRINIVIL,ZESTRIL) 20 MG tablet Take 1 tablet (20 mg total) by mouth daily.  90 tablet  1  . pravastatin (PRAVACHOL) 40 MG tablet Take 1 tablet (40 mg total) by mouth daily.  90 tablet  1   No current facility-administered medications on file prior to visit.    BP 132/76  Pulse 68  Temp(Src) 99.5 F (37.5 C)  (Oral)  Ht 5' 6.5" (1.689 m)  Wt 186 lb (84.369 kg)  BMI 29.57 kg/m2    Objective:   Physical Exam  Constitutional: He is oriented to person, place, and time. He appears well-developed and well-nourished. No distress.  HENT:  Head: Normocephalic and atraumatic.  Neck: Neck supple.  No carotid bruit  Cardiovascular: Normal rate, regular rhythm and normal heart sounds.   Pulmonary/Chest: Effort normal and breath sounds normal. He has no wheezes.  Abdominal: Soft. Bowel sounds are normal.  Left inguinal hernia  (direct and indirect)  Musculoskeletal: He exhibits no edema.  Neurological: He is alert and oriented to person, place, and time.  Psychiatric: He has a normal mood and affect. His behavior is normal.          Assessment & Plan:

## 2013-06-11 NOTE — Assessment & Plan Note (Signed)
Patient has small left inguinal hernia. He likely has direct and indirect left inguinal hernia. Refer to surgeon for elective hernia repair.

## 2013-06-15 NOTE — Progress Notes (Signed)
Quick Note:  Letter mailed to patient; advising pt to call office to changed medication if needed. ______

## 2013-06-15 NOTE — Progress Notes (Signed)
Quick Note:  Left a message for return call. ______ 

## 2013-06-21 ENCOUNTER — Telehealth: Payer: Self-pay | Admitting: Internal Medicine

## 2013-06-21 NOTE — Telephone Encounter (Signed)
Pt wants to cancel his referral appt and call back sometime in Jan. Pt did not agree to this appt and cannot keep right now. pls call back in Jan.

## 2013-06-22 NOTE — Telephone Encounter (Signed)
FYI.. Pt appt canceled. Please see message below.

## 2013-06-25 ENCOUNTER — Ambulatory Visit (INDEPENDENT_AMBULATORY_CARE_PROVIDER_SITE_OTHER): Payer: Managed Care, Other (non HMO) | Admitting: Surgery

## 2013-12-21 ENCOUNTER — Other Ambulatory Visit: Payer: Self-pay | Admitting: Internal Medicine

## 2014-07-06 ENCOUNTER — Other Ambulatory Visit: Payer: Self-pay | Admitting: Internal Medicine

## 2014-07-08 ENCOUNTER — Encounter: Payer: Self-pay | Admitting: Family Medicine

## 2014-07-08 ENCOUNTER — Ambulatory Visit (INDEPENDENT_AMBULATORY_CARE_PROVIDER_SITE_OTHER): Payer: Managed Care, Other (non HMO) | Admitting: Family Medicine

## 2014-07-08 VITALS — BP 174/113 | HR 84 | Temp 98.8°F | Resp 18 | Ht 66.5 in | Wt 188.0 lb

## 2014-07-08 DIAGNOSIS — R519 Headache, unspecified: Secondary | ICD-10-CM | POA: Insufficient documentation

## 2014-07-08 DIAGNOSIS — I10 Essential (primary) hypertension: Secondary | ICD-10-CM

## 2014-07-08 DIAGNOSIS — G4452 New daily persistent headache (NDPH): Secondary | ICD-10-CM

## 2014-07-08 DIAGNOSIS — R51 Headache: Secondary | ICD-10-CM

## 2014-07-08 MED ORDER — METOPROLOL TARTRATE 50 MG PO TABS: 50.0000 mg | ORAL_TABLET | Freq: Two times a day (BID) | ORAL | Status: DC

## 2014-07-08 MED ORDER — HYDROCODONE-ACETAMINOPHEN 5-325 MG PO TABS: 1.0000 | ORAL_TABLET | Freq: Four times a day (QID) | ORAL | Status: DC | PRN

## 2014-07-08 NOTE — Progress Notes (Signed)
OFFICE NOTE  07/08/2014  CC:  Chief Complaint  Patient presents with  . Headache    severe since for two days on and off   . Hypertension    203/116   . Ear Pain    Left sided only when pressure applied.   HPI: Patient is a 48 y.o. African male who is here for uncontrolled HTN. Has been out of his bp meds for 3 wks--just got restarted on med again yesterday.  Started feeling HA 2 days ago, pounding all over.  No abnormal vision.  No CP, no SOB, no palpitations. Has not followed up with PCP in quite some time b/c he says he has been feeling well, says home bp checks 1-2 times per month showed systolic 140-160 and diastolic under 90. For his HA he took ibuprofen 400 mg x 2 doses but otherwise has not been taking this type of med.  He does not take aspirin. He is not a smoke  Pertinent PMH:  Past medical, surgical, social, and family history reviewed and no changes are noted since last office visit.  MEDS:  Outpatient Prescriptions Prior to Visit  Medication Sig Dispense Refill  . amLODipine (NORVASC) 10 MG tablet TAKE 1 TABLET BY MOUTH DAILY. 30 tablet 0  . lisinopril (PRINIVIL,ZESTRIL) 20 MG tablet TAKE 1 TABLET BY MOUTH DAILY. 30 tablet 0  . pravastatin (PRAVACHOL) 40 MG tablet TAKE 1 TABLET BY MOUTH ONCE A DAY 90 tablet 1  . diclofenac sodium (VOLTAREN) 1 % GEL Apply 2 g topically 3 (three) times daily. (Patient not taking: Reported on 07/08/2014) 60 g 0   No facility-administered medications prior to visit.    PE: Blood pressure 174/113, pulse 84, temperature 98.8 F (37.1 C), temperature source Temporal, resp. rate 18, height 5' 6.5" (1.689 m), weight 188 lb (85.276 kg), SpO2 100 %. Gen: Alert, well appearing.  Patient is oriented to person, place, time, and situation. ZOX:WRUEENT:Eyes: no injection, icteris, swelling, or exudate.  Funcoscopy: retinal vessels normal.  Disc margins clear. EOMI, PERRLA. Mouth: lips without lesion/swelling.  Oral mucosa pink and moist. Oropharynx  without erythema, exudate, or swelling.  Neck - No masses or thyromegaly or limitation in range of motion CV: RRR, no m/r/g.   LUNGS: CTA bilat, nonlabored resps, good aeration in all lung fields. EXT: no clubbing, cyanosis, or edema.    IMPRESSION AND PLAN:  Uncontrolled hypertension Noncompliance recently. Now back on med about 2d. Continue current med, add lopressor 50 mg bid. No NSAIDs.  Vicodin 5/325, 1-2 q6h prn for severe HA, #20. Continue home bp and HR monitoring. Therapeutic expectations and side effect profile of medication discussed today.  Patient's questions answered.    An After Visit Summary was printed and given to the patient.  FOLLOW UP: 1 wk with Dr. Artist PaisYoo or myself.

## 2014-07-08 NOTE — Assessment & Plan Note (Signed)
Noncompliance recently. Now back on med about 2d. Continue current med, add lopressor 50 mg bid. No NSAIDs.  Vicodin 5/325, 1-2 q6h prn for severe HA, #20. Continue home bp and HR monitoring. Therapeutic expectations and side effect profile of medication discussed today.  Patient's questions answered.

## 2014-07-11 ENCOUNTER — Telehealth: Payer: Self-pay | Admitting: Internal Medicine

## 2014-07-11 NOTE — Telephone Encounter (Signed)
emmi mailed  °

## 2014-07-12 ENCOUNTER — Encounter: Payer: Self-pay | Admitting: Family Medicine

## 2014-07-12 ENCOUNTER — Ambulatory Visit (INDEPENDENT_AMBULATORY_CARE_PROVIDER_SITE_OTHER): Payer: Managed Care, Other (non HMO) | Admitting: Family Medicine

## 2014-07-12 VITALS — BP 156/98 | HR 74 | Temp 99.0°F | Resp 18 | Ht 66.5 in | Wt 187.0 lb

## 2014-07-12 DIAGNOSIS — I10 Essential (primary) hypertension: Secondary | ICD-10-CM

## 2014-07-12 LAB — BASIC METABOLIC PANEL
BUN: 16 mg/dL (ref 6–23)
CO2: 29 meq/L (ref 19–32)
CREATININE: 1.2 mg/dL (ref 0.4–1.5)
Calcium: 9.2 mg/dL (ref 8.4–10.5)
Chloride: 103 mEq/L (ref 96–112)
GFR: 83.75 mL/min (ref >60.00)
Glucose, Bld: 117 mg/dL — ABNORMAL HIGH (ref 70–99)
POTASSIUM: 3.6 meq/L (ref 3.5–5.1)
Sodium: 139 mEq/L (ref 135–145)

## 2014-07-12 MED ORDER — LISINOPRIL 40 MG PO TABS: 40.0000 mg | ORAL_TABLET | Freq: Every day | ORAL | Status: DC

## 2014-07-12 NOTE — Progress Notes (Signed)
Pre visit review using our clinic review tool, if applicable. No additional management support is needed unless otherwise documented below in the visit note. 

## 2014-07-12 NOTE — Assessment & Plan Note (Signed)
Improving. Increase lisinopril to 40mg  qd. Continue lopressor 50mg  bid and amlodipine 10mg  qd. Check BMET today.  He will focus more on DASH-type diet. OK to work/normal routine.  May use vicodin for severe HA but I encouraged tylenol if only mild HA.   Hopefully these HA's will fully resolve when bp under good control.

## 2014-07-12 NOTE — Progress Notes (Signed)
OFFICE NOTE  07/12/2014  CC:  Chief Complaint  Patient presents with  . Follow-up    HTN   HPI: Patient is a 48 y.o. African-American male who is here for 4 d f/u uncontrolled HTN and HA's assoc with this. Feeling better, HA's much improved.  He only checked his bp once: 165/103 two days ago, no HR checked.   He has no new complaints.  Pertinent PMH:  Past medical, surgical, social, and family history reviewed and no changes are noted since last office visit.  MEDS:  Outpatient Prescriptions Prior to Visit  Medication Sig Dispense Refill  . amLODipine (NORVASC) 10 MG tablet TAKE 1 TABLET BY MOUTH DAILY. 30 tablet 0  . diclofenac sodium (VOLTAREN) 1 % GEL Apply 2 g topically 3 (three) times daily. 60 g 0  . HYDROcodone-acetaminophen (NORCO/VICODIN) 5-325 MG per tablet Take 1-2 tablets by mouth every 6 (six) hours as needed for moderate pain. 20 tablet 0  . metoprolol (LOPRESSOR) 50 MG tablet Take 1 tablet (50 mg total) by mouth 2 (two) times daily. 60 tablet 0  . pravastatin (PRAVACHOL) 40 MG tablet TAKE 1 TABLET BY MOUTH ONCE A DAY 90 tablet 1  . lisinopril (PRINIVIL,ZESTRIL) 20 MG tablet TAKE 1 TABLET BY MOUTH DAILY. 30 tablet 0   No facility-administered medications prior to visit.    PE: Blood pressure 156/98, pulse 74, temperature 99 F (37.2 C), temperature source Temporal, resp. rate 18, height 5' 6.5" (1.689 m), weight 187 lb (84.823 kg), SpO2 99 %. Gen: Alert, well appearing.  Patient is oriented to person, place, time, and situation. CV: RRR, no m/r/g.   LUNGS: CTA bilat, nonlabored resps, good aeration in all lung fields. ABD: soft, NT, ND, BS normal.  No hepatospenomegaly or mass.  No bruits. EXT: no clubbing, cyanosis, or edema.    IMPRESSION AND PLAN:  Uncontrolled hypertension Improving. Increase lisinopril to 40mg  qd. Continue lopressor 50mg  bid and amlodipine 10mg  qd. Check BMET today.  He will focus more on DASH-type diet. OK to work/normal routine.   May use vicodin for severe HA but I encouraged tylenol if only mild HA.   Hopefully these HA's will fully resolve when bp under good control.   An After Visit Summary was printed and given to the patient.  FOLLOW UP: 10d

## 2014-07-22 ENCOUNTER — Ambulatory Visit: Payer: Managed Care, Other (non HMO) | Admitting: Family Medicine

## 2014-07-26 ENCOUNTER — Ambulatory Visit (INDEPENDENT_AMBULATORY_CARE_PROVIDER_SITE_OTHER): Payer: Managed Care, Other (non HMO) | Admitting: Family Medicine

## 2014-07-26 ENCOUNTER — Encounter: Payer: Self-pay | Admitting: Family Medicine

## 2014-07-26 VITALS — BP 168/91 | HR 59 | Temp 98.3°F | Ht 66.5 in | Wt 192.0 lb

## 2014-07-26 DIAGNOSIS — I10 Essential (primary) hypertension: Secondary | ICD-10-CM

## 2014-07-26 MED ORDER — IRBESARTAN-HYDROCHLOROTHIAZIDE 300-12.5 MG PO TABS: 1.0000 | ORAL_TABLET | Freq: Every day | ORAL | Status: DC

## 2014-07-26 NOTE — Progress Notes (Signed)
Pre visit review using our clinic review tool, if applicable. No additional management support is needed unless otherwise documented below in the visit note. 

## 2014-07-26 NOTE — Progress Notes (Signed)
OFFICE NOTE  07/26/2014  CC:  Chief Complaint  Patient presents with  . Follow-up   HPI: Patient is a 48 y.o. African male who is here for 2 week f/u uncontrolled HTN. Feeling well.  Home bp: 150s on top, 80s on bottom.  He is not monitoring resting HR.  Pertinent PMH:  Past medical, surgical, social, and family history reviewed and no changes are noted since last office visit.  MEDS:  Outpatient Prescriptions Prior to Visit  Medication Sig Dispense Refill  . amLODipine (NORVASC) 10 MG tablet TAKE 1 TABLET BY MOUTH DAILY. 30 tablet 0  . diclofenac sodium (VOLTAREN) 1 % GEL Apply 2 g topically 3 (three) times daily. 60 g 0  . HYDROcodone-acetaminophen (NORCO/VICODIN) 5-325 MG per tablet Take 1-2 tablets by mouth every 6 (six) hours as needed for moderate pain. 20 tablet 0  . lisinopril (PRINIVIL,ZESTRIL) 40 MG tablet Take 1 tablet (40 mg total) by mouth daily. 30 tablet 11  . metoprolol (LOPRESSOR) 50 MG tablet Take 1 tablet (50 mg total) by mouth 2 (two) times daily. 60 tablet 0  . pravastatin (PRAVACHOL) 40 MG tablet TAKE 1 TABLET BY MOUTH ONCE A DAY 90 tablet 1   No facility-administered medications prior to visit.    PE: Blood pressure 168/91, pulse 59, temperature 98.3 F (36.8 C), temperature source Temporal, height 5' 6.5" (1.689 m), weight 192 lb (87.091 kg), SpO2 96 %. Gen: Alert, well appearing.  Patient is oriented to person, place, time, and situation. AFFECT: pleasant, lucid thought and speech. No further exam today.  LAB:    Chemistry      Component Value Date/Time   NA 139 07/12/2014 1054   K 3.6 07/12/2014 1054   CL 103 07/12/2014 1054   CO2 29 07/12/2014 1054   BUN 16 07/12/2014 1054   CREATININE 1.2 07/12/2014 1054      Component Value Date/Time   CALCIUM 9.2 07/12/2014 1054   ALKPHOS 98 06/08/2013 1343   AST 19 06/08/2013 1343   ALT 19 06/08/2013 1343   BILITOT 0.4 06/08/2013 1343      IMPRESSION AND PLAN:  Uncontrolled HTN: gradually  improving. Asymptomatic. D/c lisinopril 40mg . Start generic avalide HCT 300/12.5 qd in its place, while continuing current dosing of amlodipine and lopressor. He'll try to get new bp machine for home for better monitoring of BOTH bp and HR. He is working on dietary changes, continue good activity level.  FOLLOW UP: 2 weeks, repeat BMET at that time.

## 2014-08-05 DIAGNOSIS — E119 Type 2 diabetes mellitus without complications: Secondary | ICD-10-CM

## 2014-08-05 HISTORY — DX: Type 2 diabetes mellitus without complications: E11.9

## 2014-08-11 ENCOUNTER — Encounter: Payer: Self-pay | Admitting: Family Medicine

## 2014-08-11 ENCOUNTER — Ambulatory Visit (INDEPENDENT_AMBULATORY_CARE_PROVIDER_SITE_OTHER): Payer: Managed Care, Other (non HMO) | Admitting: Family Medicine

## 2014-08-11 VITALS — BP 150/69 | HR 85 | Temp 98.4°F | Resp 18 | Ht 66.5 in | Wt 191.0 lb

## 2014-08-11 DIAGNOSIS — I10 Essential (primary) hypertension: Secondary | ICD-10-CM

## 2014-08-11 LAB — BASIC METABOLIC PANEL
BUN: 15 mg/dL (ref 6–23)
CO2: 30 mEq/L (ref 19–32)
Calcium: 9.8 mg/dL (ref 8.4–10.5)
Chloride: 100 mEq/L (ref 96–112)
Creatinine, Ser: 1 mg/dL (ref 0.4–1.5)
GFR: 103.33 mL/min (ref >60.00)
Glucose, Bld: 132 mg/dL — ABNORMAL HIGH (ref 70–99)
Potassium: 3.4 mEq/L — ABNORMAL LOW (ref 3.5–5.1)
Sodium: 139 mEq/L (ref 135–145)

## 2014-08-11 MED ORDER — METOPROLOL TARTRATE 50 MG PO TABS: 50.0000 mg | ORAL_TABLET | Freq: Two times a day (BID) | ORAL | Status: DC

## 2014-08-11 MED ORDER — AMLODIPINE BESYLATE 10 MG PO TABS: 10.0000 mg | ORAL_TABLET | Freq: Every day | ORAL | Status: DC

## 2014-08-11 MED ORDER — IRBESARTAN-HYDROCHLOROTHIAZIDE 300-12.5 MG PO TABS: 1.0000 | ORAL_TABLET | Freq: Every day | ORAL | Status: DC

## 2014-08-11 NOTE — Progress Notes (Signed)
Pre visit review using our clinic review tool, if applicable. No additional management support is needed unless otherwise documented below in the visit note. 

## 2014-08-11 NOTE — Progress Notes (Signed)
OFFICE NOTE  08/11/2014  CC:  Chief Complaint  Patient presents with  . Follow-up    BP   HPI: Patient is a 49 y.o. African male who is here for 2 week f/u uncontrolled HTN.   Has been off metoprolol for last 5-6 d. BP checks at work OFF of metop have been 160s-170s over 70s.   He does not monitor his HR.  He is exercising and trying to watch his diet (DASH), drinking more water.  No HAs, no vision problems, no dizziness.  Pertinent PMH:  Past medical, surgical, social, and family history reviewed and no changes are noted since last office visit.  MEDS:  Outpatient Prescriptions Prior to Visit  Medication Sig Dispense Refill  . amLODipine (NORVASC) 10 MG tablet TAKE 1 TABLET BY MOUTH DAILY. 30 tablet 0  . irbesartan-hydrochlorothiazide (AVALIDE) 300-12.5 MG per tablet Take 1 tablet by mouth daily. 30 tablet 1  . metoprolol (LOPRESSOR) 50 MG tablet Take 1 tablet (50 mg total) by mouth 2 (two) times daily. 60 tablet 0  . pravastatin (PRAVACHOL) 40 MG tablet TAKE 1 TABLET BY MOUTH ONCE A DAY 90 tablet 1  . diclofenac sodium (VOLTAREN) 1 % GEL Apply 2 g topically 3 (three) times daily. (Patient not taking: Reported on 08/11/2014) 60 g 0  . HYDROcodone-acetaminophen (NORCO/VICODIN) 5-325 MG per tablet Take 1-2 tablets by mouth every 6 (six) hours as needed for moderate pain. (Patient not taking: Reported on 08/11/2014) 20 tablet 0   No facility-administered medications prior to visit.    PE: Blood pressure 150/69, pulse 85, temperature 98.4 F (36.9 C), temperature source Oral, resp. rate 18, height 5' 6.5" (1.689 m), weight 191 lb (86.637 kg), SpO2 98 %. Gen: Alert, well appearing.  Patient is oriented to person, place, time, and situation. CV: RRR, no m/r/g.   LUNGS: CTA bilat, nonlabored resps, good aeration in all lung fields. EXT: no clubbing, cyanosis, or edema.   LAB:    Chemistry      Component Value Date/Time   NA 139 07/12/2014 1054   K 3.6 07/12/2014 1054   CL 103  07/12/2014 1054   CO2 29 07/12/2014 1054   BUN 16 07/12/2014 1054   CREATININE 1.2 07/12/2014 1054      Component Value Date/Time   CALCIUM 9.2 07/12/2014 1054   ALKPHOS 98 06/08/2013 1343   AST 19 06/08/2013 1343   ALT 19 06/08/2013 1343   BILITOT 0.4 06/08/2013 1343      IMPRESSION AND PLAN:  Uncontrolled HTN, improving. Continue current dosing of all meds, GET BACK on metoprolol 50mg  bid. Recheck BMET today. Continue DASH diet + exercise.  An After Visit Summary was printed and given to the patient.  FOLLOW UP: 4 wks

## 2014-08-12 ENCOUNTER — Encounter: Payer: Self-pay | Admitting: *Deleted

## 2014-09-14 ENCOUNTER — Ambulatory Visit (INDEPENDENT_AMBULATORY_CARE_PROVIDER_SITE_OTHER): Payer: Managed Care, Other (non HMO) | Admitting: Family Medicine

## 2014-09-14 ENCOUNTER — Encounter: Payer: Self-pay | Admitting: Family Medicine

## 2014-09-14 VITALS — BP 158/105 | HR 58 | Temp 97.8°F | Ht 66.5 in | Wt 188.0 lb

## 2014-09-14 DIAGNOSIS — M25512 Pain in left shoulder: Secondary | ICD-10-CM

## 2014-09-14 DIAGNOSIS — I10 Essential (primary) hypertension: Secondary | ICD-10-CM

## 2014-09-14 MED ORDER — DICLOFENAC SODIUM 1 % TD GEL: TRANSDERMAL | Status: DC

## 2014-09-14 NOTE — Progress Notes (Signed)
Pre visit review using our clinic review tool, if applicable. No additional management support is needed unless otherwise documented below in the visit note. 

## 2014-09-14 NOTE — Progress Notes (Signed)
OFFICE NOTE  09/14/2014  CC:  Chief Complaint  Patient presents with  . Follow-up   HPI: Patient is a 49 y.o. African male who is here for 1 mo f/u uncontrolled HTN. Still with stage 1 HTN on home bp checks.  No HA's at all.  No CP or SOB or dizziness. Stopped avalide (?when?) b/c he thought maybe it was causing some blurry vision.  However, bp up and down and he's not sure if the med was the cause after all.  Left shoulder pain x 2-3 mo, ant shoulder and over deltoid.  Hurts worse with reaching and with lying on it at night. No distinct injury recalled.  No arm paresthesias.  No neck pain.  Not using anything for pain.  Pertinent PMH:  Past medical, surgical, social, and family history reviewed and no changes are noted since last office visit.  MEDS:  Outpatient Prescriptions Prior to Visit  Medication Sig Dispense Refill  . amLODipine (NORVASC) 10 MG tablet Take 1 tablet (10 mg total) by mouth daily. 30 tablet 6  . diclofenac sodium (VOLTAREN) 1 % GEL Apply 2 g topically 3 (three) times daily. 60 g 0  . metoprolol (LOPRESSOR) 50 MG tablet Take 1 tablet (50 mg total) by mouth 2 (two) times daily. 60 tablet 6  . pravastatin (PRAVACHOL) 40 MG tablet TAKE 1 TABLET BY MOUTH ONCE A DAY 90 tablet 1  . irbesartan-hydrochlorothiazide (AVALIDE) 300-12.5 MG per tablet Take 1 tablet by mouth daily. (Patient not taking: Reported on 09/14/2014) 30 tablet 6   No facility-administered medications prior to visit.    PE: Blood pressure 158/105, pulse 58, temperature 97.8 F (36.6 C), temperature source Temporal, height 5' 6.5" (1.689 m), weight 188 lb (85.276 kg), SpO2 99 %. Gen: Alert, well appearing.  Patient is oriented to person, place, time, and situation. CV: RRR, no m/r/g.   LUNGS: CTA bilat, nonlabored resps, good aeration in all lung fields. ABD: soft, NT, ND, BS normal.  No hepatospenomegaly or mass.  No bruits. EXT: no clubbing, cyanosis, or edema.  Left shoulder with no deformity,  no limitation or pain with ROM, no tenderness to palpation except possibly a small area near long head of biceps.  No impingement signs.  Strength 5/5 prox and dist.  Neck ROM normal, neck nontender.  IMPRESSION AND PLAN:  1) Uncontrolled HTN, noncompliant with avalide (question of blurry vision side effect but pt not for sure).  I recommended he restart his avalide and continue all other current meds.  Continue home bp monitoring, DASH diet. Will check renal artery dopplers to rule our RAS as possible cause of secondary HTN in this pt.  2) Left shoulder arthralgia: exam unremarkable.  Rx'd voltaren gel 2 g tid prn.  An After Visit Summary was printed and given to the patient.  FOLLOW UP: 1 mo

## 2014-09-14 NOTE — Patient Instructions (Signed)
Restart avalide and continue all other meds that you have been taking.

## 2014-10-04 HISTORY — PX: OTHER SURGICAL HISTORY: SHX169

## 2014-10-12 ENCOUNTER — Ambulatory Visit: Payer: Managed Care, Other (non HMO) | Admitting: Family Medicine

## 2014-10-13 ENCOUNTER — Encounter: Payer: Self-pay | Admitting: Family Medicine

## 2014-10-13 ENCOUNTER — Other Ambulatory Visit: Payer: Self-pay | Admitting: Family Medicine

## 2014-10-13 ENCOUNTER — Ambulatory Visit (INDEPENDENT_AMBULATORY_CARE_PROVIDER_SITE_OTHER): Payer: Managed Care, Other (non HMO) | Admitting: Family Medicine

## 2014-10-13 VITALS — BP 149/84 | HR 56 | Temp 98.6°F | Ht 66.5 in | Wt 190.0 lb

## 2014-10-13 DIAGNOSIS — I10 Essential (primary) hypertension: Secondary | ICD-10-CM

## 2014-10-13 DIAGNOSIS — M25512 Pain in left shoulder: Secondary | ICD-10-CM

## 2014-10-13 MED ORDER — SPIRONOLACTONE 25 MG PO TABS: 25.0000 mg | ORAL_TABLET | Freq: Two times a day (BID) | ORAL | Status: DC

## 2014-10-13 NOTE — Progress Notes (Signed)
Pre visit review using our clinic review tool, if applicable. No additional management support is needed unless otherwise documented below in the visit note. 

## 2014-10-13 NOTE — Progress Notes (Signed)
OFFICE NOTE  10/13/2014  CC:  Chief Complaint  Patient presents with  . Follow-up     HPI: Patient is a 49 y.o. African male who is here for 1 mo f/u HTN. BP checks at pharmacy 150s/70s average.  Highest 165 syst.  Diastolic always <90. He is still exercising at home. Says he is being compliant with all bp meds now. No longer having any c/o blurry vision.  HA comes once in a while.  Says left shoulder still hurting on/off.  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  He feels like the diclofenac gel is helping some.   Pertinent PMH:  Past medical, surgical, social, and family history reviewed and no changes are noted since last office visit.  MEDS:  Outpatient Prescriptions Prior to Visit  Medication Sig Dispense Refill  . amLODipine (NORVASC) 10 MG tablet Take 1 tablet (10 mg total) by mouth daily. 30 tablet 6  . diclofenac sodium (VOLTAREN) 1 % GEL Apply 2g to left shoulder qid 100 g 0  . metoprolol (LOPRESSOR) 50 MG tablet Take 1 tablet (50 mg total) by mouth 2 (two) times daily. 60 tablet 6  . pravastatin (PRAVACHOL) 40 MG tablet TAKE 1 TABLET BY MOUTH ONCE A DAY 90 tablet 1  . irbesartan-hydrochlorothiazide (AVALIDE) 300-12.5 MG per tablet Take 1 tablet by mouth daily. (Patient not taking: Reported on 09/14/2014) 30 tablet 6   No facility-administered medications prior to visit.    PE: Blood pressure 149/84, pulse 56, temperature 98.6 F (37 C), temperature source Temporal, height 5' 6.5" (1.689 m), weight 190 lb (86.183 kg), SpO2 99 %. Gen: Alert, well appearing.  Patient is oriented to person, place, time, and situation. CV: RRR, no m/r/g.   LUNGS: CTA bilat, nonlabored resps, good aeration in all lung fields. ABD: no bruit EXT: no clubbing, cyanosis, or edema.   LAB: none today   Chemistry      Component Value Date/Time   NA 139 08/11/2014 1539   K 3.4* 08/11/2014 1539   CL 100 08/11/2014 1539   CO2 30  08/11/2014 1539   BUN 15 08/11/2014 1539   CREATININE 1.0 08/11/2014 1539      Component Value Date/Time   CALCIUM 9.8 08/11/2014 1539   ALKPHOS 98 06/08/2013 1343   AST 19 06/08/2013 1343   ALT 19 06/08/2013 1343   BILITOT 0.4 06/08/2013 1343     Lab Results  Component Value Date   CHOL 129 06/08/2013   HDL 45.40 06/08/2013   LDLCALC 72 06/08/2013   TRIG 59.0 06/08/2013   CHOLHDL 3 06/08/2013      IMPRESSION AND PLAN:  1) Uncontrolled HTN:  Will continue current meds and add aldactone 25mg  bid. Renal dopplers fell through the cracks so this is now going to get scheduled ASAP.  2) Left shoulder pain: improved with use of voltaren gel.  An After Visit Summary was printed and given to the patient.  FOLLOW UP: 1 mo

## 2014-10-21 ENCOUNTER — Encounter (HOSPITAL_COMMUNITY): Payer: Managed Care, Other (non HMO)

## 2014-10-24 ENCOUNTER — Ambulatory Visit (HOSPITAL_COMMUNITY): Payer: Managed Care, Other (non HMO) | Attending: Cardiology | Admitting: Cardiology

## 2014-10-24 DIAGNOSIS — I10 Essential (primary) hypertension: Secondary | ICD-10-CM | POA: Diagnosis present

## 2014-10-24 NOTE — Progress Notes (Signed)
Renal artery duplex performed  

## 2014-10-27 ENCOUNTER — Encounter: Payer: Self-pay | Admitting: Family Medicine

## 2014-11-10 ENCOUNTER — Ambulatory Visit: Payer: Managed Care, Other (non HMO) | Admitting: Family Medicine

## 2014-11-11 ENCOUNTER — Ambulatory Visit (INDEPENDENT_AMBULATORY_CARE_PROVIDER_SITE_OTHER): Payer: Managed Care, Other (non HMO) | Admitting: Family Medicine

## 2014-11-11 ENCOUNTER — Encounter: Payer: Self-pay | Admitting: Family Medicine

## 2014-11-11 VITALS — BP 140/80 | HR 54 | Temp 97.4°F | Ht 66.5 in | Wt 191.0 lb

## 2014-11-11 DIAGNOSIS — I10 Essential (primary) hypertension: Secondary | ICD-10-CM | POA: Diagnosis not present

## 2014-11-11 NOTE — Progress Notes (Signed)
Pre visit review using our clinic review tool, if applicable. No additional management support is needed unless otherwise documented below in the visit note. 

## 2014-11-11 NOTE — Progress Notes (Signed)
OFFICE NOTE  11/11/2014  CC:  Chief Complaint  Patient presents with  . Follow-up    4 weeks   HPI: Patient is a 49 y.o. African male who is here for 1 month f/u uncontrolled HTN. Renal artery dopplers were normal 10/24/14. I added aldactone  bid to his regimen last o/v.  No probs with this med.  He has only checked his bp on 3 occasions and avg syst 130s and avg diast 70s. He has determined that he was getting HAs due to one of his meds, so he did elimination of each briefly and determined that his irbesartan/hctz was the culprit and when he stopped this med his HAs completely resolved.  Still trying to do home exercises.  Says he has felt great since last visit other than the HA's.    He is not fasting today.  Pertinent PMH:  Past Medical History  Diagnosis Date  . Hypertension   . Hyperlipidemia   . Chronic renal insufficiency, stage II (mild)    Past Surgical History  Procedure Laterality Date  . Renal artery dopplers  10/2014    NORMAL    MEDS:  Outpatient Prescriptions Prior to Visit  Medication Sig Dispense Refill  . amLODipine (NORVASC) 10 MG tablet Take 1 tablet (10 mg total) by mouth daily. 30 tablet 6  . diclofenac sodium (VOLTAREN) 1 % GEL Apply 2g to left shoulder qid 100 g 0  . metoprolol (LOPRESSOR) 50 MG tablet Take 1 tablet (50 mg total) by mouth 2 (two) times daily. 60 tablet 6  . pravastatin (PRAVACHOL) 40 MG tablet TAKE 1 TABLET BY MOUTH ONCE A DAY 90 tablet 1  . spironolactone (ALDACTONE) 25 MG tablet Take 1 tablet (25 mg total) by mouth 2 (two) times daily. 60 tablet 1  . irbesartan-hydrochlorothiazide (AVALIDE) 300-12.5 MG per tablet Take 1 tablet by mouth daily. (Patient not taking: Reported on 11/11/2014) 30 tablet 6   No facility-administered medications prior to visit.    PE: Blood pressure 140/80, pulse 54, temperature 97.4 F (36.3 C), temperature source Oral, height 5' 6.5" (1.689 m), weight 191 lb (86.637 kg), SpO2 98 %. Gen: Alert, well  appearing.  Patient is oriented to person, place, time, and situation. CV: RRR., trace systolic ejection murmur heard best at cardiac base.  No rub or gallop or diastolic murmur. LUNGS: CTA bilat, nonlabored. EXT: no clubbing, cyanosis, or edema.   LABS:  Lab Results  Component Value Date   TSH 1.03 03/30/2013   Lab Results  Component Value Date   WBC 5.1 03/30/2013   HGB 14.5 03/30/2013   HCT 42.6 03/30/2013   MCV 91.4 03/30/2013   PLT 231.0 03/30/2013   Lab Results  Component Value Date   CREATININE 1.0 08/11/2014   BUN 15 08/11/2014   NA 139 08/11/2014   K 3.4* 08/11/2014   CL 100 08/11/2014   CO2 30 08/11/2014   Lab Results  Component Value Date   ALT 19 06/08/2013   AST 19 06/08/2013   ALKPHOS 98 06/08/2013   BILITOT 0.4 06/08/2013   Lab Results  Component Value Date   CHOL 129 06/08/2013   Lab Results  Component Value Date   HDL 45.40 06/08/2013   Lab Results  Component Value Date   LDLCALC 72 06/08/2013   Lab Results  Component Value Date   TRIG 59.0 06/08/2013   Lab Results  Component Value Date   CHOLHDL 3 06/08/2013    IMPRESSION AND PLAN:  HTN, control  much improved. HA's secondary to avalide so he is off this med, but I warned him that he needs to continue home monitoring and he may need to go on a different med in its place in near future. Will have him return at earliest convenience for fasting lipids and CMET. Continue all current meds for now. Plan on next office f/u in 3 mo, earlier if BP's not consistently <140/90 at home.  An After Visit Summary was printed and given to the patient.

## 2014-12-05 ENCOUNTER — Other Ambulatory Visit: Payer: Self-pay | Admitting: Family Medicine

## 2014-12-05 ENCOUNTER — Other Ambulatory Visit (INDEPENDENT_AMBULATORY_CARE_PROVIDER_SITE_OTHER): Payer: Managed Care, Other (non HMO)

## 2014-12-05 DIAGNOSIS — E785 Hyperlipidemia, unspecified: Secondary | ICD-10-CM

## 2014-12-05 DIAGNOSIS — I10 Essential (primary) hypertension: Secondary | ICD-10-CM | POA: Diagnosis not present

## 2014-12-05 LAB — LIPID PANEL
Cholesterol: 138 mg/dL (ref 0–200)
HDL: 39.4 mg/dL (ref >39.00)
LDL CALC: 87 mg/dL (ref 0–99)
NONHDL: 98.6
TRIGLYCERIDES: 58 mg/dL (ref 0.0–149.0)
Total CHOL/HDL Ratio: 4
VLDL: 11.6 mg/dL (ref 0.0–40.0)

## 2014-12-05 LAB — COMPREHENSIVE METABOLIC PANEL
ALT: 20 U/L (ref 0–53)
AST: 18 U/L (ref 0–37)
Albumin: 4.1 g/dL (ref 3.5–5.2)
Alkaline Phosphatase: 104 U/L (ref 39–117)
BUN: 15 mg/dL (ref 6–23)
CALCIUM: 9.4 mg/dL (ref 8.4–10.5)
CO2: 28 mEq/L (ref 19–32)
CREATININE: 1.27 mg/dL (ref 0.40–1.50)
Chloride: 104 mEq/L (ref 96–112)
GFR: 78.31 mL/min (ref >60.00)
GLUCOSE: 121 mg/dL — AB (ref 70–99)
Potassium: 4.4 mEq/L (ref 3.5–5.1)
Sodium: 138 mEq/L (ref 135–145)
Total Bilirubin: 0.4 mg/dL (ref 0.2–1.2)
Total Protein: 7.5 g/dL (ref 6.0–8.3)

## 2014-12-06 ENCOUNTER — Other Ambulatory Visit: Payer: Self-pay | Admitting: Family Medicine

## 2014-12-06 ENCOUNTER — Other Ambulatory Visit (INDEPENDENT_AMBULATORY_CARE_PROVIDER_SITE_OTHER): Payer: Managed Care, Other (non HMO)

## 2014-12-06 DIAGNOSIS — R7301 Impaired fasting glucose: Secondary | ICD-10-CM | POA: Diagnosis not present

## 2014-12-06 LAB — HEMOGLOBIN A1C: HEMOGLOBIN A1C: 6.7 % — AB (ref 4.6–6.5)

## 2014-12-12 ENCOUNTER — Other Ambulatory Visit: Payer: Self-pay | Admitting: Family Medicine

## 2014-12-12 DIAGNOSIS — E119 Type 2 diabetes mellitus without complications: Secondary | ICD-10-CM

## 2015-01-28 ENCOUNTER — Other Ambulatory Visit: Payer: Self-pay | Admitting: Family Medicine

## 2015-01-30 NOTE — Telephone Encounter (Signed)
RF request for Spironolactone.  LOV: 11/11/14 Next ov: None Last written: 10/13/14 #60 w/ 1RF

## 2015-02-07 ENCOUNTER — Other Ambulatory Visit: Payer: Self-pay | Admitting: Internal Medicine

## 2016-03-08 ENCOUNTER — Other Ambulatory Visit: Payer: Self-pay | Admitting: Family Medicine

## 2016-03-08 NOTE — Telephone Encounter (Signed)
Patient is leaving town Sunday. [facility] is telling patient that Rx Spironolactone will not be ready until Monday. Please contact pharmacy.

## 2016-03-08 NOTE — Telephone Encounter (Signed)
Spoke to Ksenia at [facility] and she stated that Rx is ready for p/u.   Left message for pt to call back. Please also advise pt he is due for office visit and will need to be seen for more refills. Thanks.

## 2016-03-08 NOTE — Telephone Encounter (Signed)
RF request for spironolactone LOV: 11/11/14 Next ov: None Last written: 01/30/15 #60 w/ 1RF  Rx sent for #60 w/ 0RF. Pt needs office visit for more refills.

## 2016-03-12 NOTE — Telephone Encounter (Signed)
Left message on cell vm for pt to call back.  

## 2016-03-25 NOTE — Telephone Encounter (Signed)
Left message for pt to call back  °

## 2016-03-29 NOTE — Telephone Encounter (Signed)
Pt has not returned any of my calls, will save message to chart. 

## 2016-08-05 DIAGNOSIS — B54 Unspecified malaria: Secondary | ICD-10-CM

## 2016-08-05 HISTORY — DX: Unspecified malaria: B54

## 2017-04-21 ENCOUNTER — Telehealth: Payer: Self-pay | Admitting: Family Medicine

## 2017-04-21 NOTE — Telephone Encounter (Signed)
Opened in error, appt scheduled instead.

## 2017-04-23 ENCOUNTER — Encounter: Payer: Self-pay | Admitting: Family Medicine

## 2017-04-23 ENCOUNTER — Ambulatory Visit (INDEPENDENT_AMBULATORY_CARE_PROVIDER_SITE_OTHER): Payer: Managed Care, Other (non HMO) | Admitting: Family Medicine

## 2017-04-23 VITALS — BP 176/116 | HR 83 | Temp 98.5°F | Resp 16 | Ht 66.5 in | Wt 184.5 lb

## 2017-04-23 DIAGNOSIS — E119 Type 2 diabetes mellitus without complications: Secondary | ICD-10-CM | POA: Diagnosis not present

## 2017-04-23 DIAGNOSIS — Z9119 Patient's noncompliance with other medical treatment and regimen: Secondary | ICD-10-CM | POA: Diagnosis not present

## 2017-04-23 DIAGNOSIS — I10 Essential (primary) hypertension: Secondary | ICD-10-CM | POA: Diagnosis not present

## 2017-04-23 DIAGNOSIS — Z91199 Patient's noncompliance with other medical treatment and regimen due to unspecified reason: Secondary | ICD-10-CM

## 2017-04-23 LAB — COMPREHENSIVE METABOLIC PANEL
ALBUMIN: 4.2 g/dL (ref 3.5–5.2)
ALT: 17 U/L (ref 0–53)
AST: 17 U/L (ref 0–37)
Alkaline Phosphatase: 92 U/L (ref 39–117)
BUN: 15 mg/dL (ref 6–23)
CHLORIDE: 99 meq/L (ref 96–112)
CO2: 37 mEq/L — ABNORMAL HIGH (ref 19–32)
Calcium: 10.2 mg/dL (ref 8.4–10.5)
Creatinine, Ser: 1.11 mg/dL (ref 0.40–1.50)
GFR: 90.56 mL/min (ref >60.00)
Glucose, Bld: 162 mg/dL — ABNORMAL HIGH (ref 70–99)
Potassium: 3.8 mEq/L (ref 3.5–5.1)
Sodium: 140 mEq/L (ref 135–145)
Total Bilirubin: 0.4 mg/dL (ref 0.2–1.2)
Total Protein: 7.2 g/dL (ref 6.0–8.3)

## 2017-04-23 LAB — HEMOGLOBIN A1C: Hgb A1c MFr Bld: 6 % (ref 4.6–6.5)

## 2017-04-23 NOTE — Progress Notes (Signed)
OFFICE VISIT  04/23/2017   CC:  Chief Complaint  Patient presents with  . Follow-up    HTN     HPI:    Patient is a 51 y.o.  male who presents for elevated blood pressures.  I have not seen him since 2016, at which time his bp was relatively well controlled and I dx'd him with new dx DM 2 as well (A1c 6.7%).  Pt declined med for DM at that time. I referred him to a nutritionist at that time.  Pt thinks it was because he ate something too salty.  Says after eating Talapia soup w/ a lot of salt and felt HA on top of head.  He ate the Talapia soup again and HA returned.   He has not checked his blood pressure.  He stopped his bp meds 1.5 yrs ago b/c his bp had normalized on the med and he felt well.   He did not monitor bp at all since last visit. No NSAIDs with any regularity.  He is exercising well.  HTN: irbesartan/HCT seemed to cause him HAs.  Renal artery dopplers NEG 2016. He was on lopressor  bid, amlodipine  qd, and spironolactone  bid.  He seems to have forgotten that we dx'd him with DM--did not f/u as I requested.  ROS: polydipsia and polyuria.  No vision c/o.  No  Focal or generalized weakness.  No dizziness.  No CP, no SOB, no dizziness.  No n/v, no diaphoresis.  Past Medical History:  Diagnosis Date  . Chronic renal insufficiency, stage II (mild)   . Hyperlipidemia   . Hypertension     Past Surgical History:  Procedure Laterality Date  . Renal artery dopplers  10/2014   NORMAL    Outpatient Medications Prior to Visit  Medication Sig Dispense Refill  . amLODipine (NORVASC) 10 MG tablet TAKE 1 TABLET BY MOUTH DAILY. (Patient not taking: Reported on 04/23/2017) 90 tablet 3  . diclofenac sodium (VOLTAREN) 1 % GEL Apply 2g to left shoulder qid (Patient not taking: Reported on 04/23/2017) 100 g 0  . metoprolol (LOPRESSOR) 50 MG tablet Take 1 tablet (50 mg total) by mouth 2 (two) times daily. (Patient not taking: Reported on 04/23/2017) 60 tablet 6  .  pravastatin (PRAVACHOL) 40 MG tablet TAKE 1 TABLET BY MOUTH ONCE A DAY (Patient not taking: Reported on 04/23/2017) 90 tablet 3  . spironolactone (ALDACTONE) 25 MG tablet TAKE 1 TABLET BY MOUTH 2 TIMES DAILY. (Patient not taking: Reported on 04/23/2017) 60 tablet 0   No facility-administered medications prior to visit.     Allergies  Allergen Reactions  . Chloroquine Phosphate     REACTION: Itching  . Influenza Vaccines Itching and Swelling    ROS As per HPI  PE: Blood pressure (!) 176/116, pulse 83, temperature 98.5 F (36.9 C), temperature source Oral, resp. rate 16, height 5' 6.5" (1.689 m), weight 184 lb 8 oz (83.7 kg), SpO2 99 %. Gen: Alert, well appearing.  Patient is oriented to person, place, time, and situation. AFFECT: pleasant, lucid thought and speech. ZOX:WRUE: no injection, icteris, swelling, or exudate.  EOMI, PERRLA. Mouth: lips without lesion/swelling.  Oral mucosa pink and moist. Oropharynx without erythema, exudate, or swelling.  CV: RRR, no m/r/g.   LUNGS: CTA bilat, nonlabored resps, good aeration in all lung fields. EXT: no clubbing, cyanosis, or edema.  Neuro: CN 2-12 intact bilaterally, strength 5/5 in proximal and distal upper extremities and lower extremities bilaterally.  No tremor.  No ataxia.  No pronator drift.   LABS:  Lab Results  Component Value Date   TSH 1.03 03/30/2013   Lab Results  Component Value Date   WBC 5.1 03/30/2013   HGB 14.5 03/30/2013   HCT 42.6 03/30/2013   MCV 91.4 03/30/2013   PLT 231.0 03/30/2013   Lab Results  Component Value Date   CREATININE 1.27 12/05/2014   BUN 15 12/05/2014   NA 138 12/05/2014   K 4.4 12/05/2014   CL 104 12/05/2014   CO2 28 12/05/2014   Lab Results  Component Value Date   ALT 20 12/05/2014   AST 18 12/05/2014   ALKPHOS 104 12/05/2014   BILITOT 0.4 12/05/2014   Lab Results  Component Value Date   CHOL 138 12/05/2014   Lab Results  Component Value Date   HDL 39.40 12/05/2014    Lab Results  Component Value Date   LDLCALC 87 12/05/2014   Lab Results  Component Value Date   TRIG 58.0 12/05/2014   Lab Results  Component Value Date   CHOLHDL 4 12/05/2014   Lab Results  Component Value Date   HGBA1C 6.7 (H) 12/06/2014    IMPRESSION AND PLAN:  1) Uncontrolled HTN; due to medication noncompliance. Needs to restart amlodipine  qd, spironolactone  bid, and lopressor  bid. Encouraged him to monitor bp and HR at home. F/u in office 5d for recheck. CMET today.  2) DM 2; having sx's of hyperglycemia. Will check HbA1c and random glucose today.  3) Medical noncompliance: we discussed this today.  I told him that the bp meds do not "cure" his high blood pressure. He needs to stay on them indefinitely.  Also, I reminded him that we dx'd him with DM 2 and he'll need to continue to return to see me periodically for this. Pt expressed understanding.  An After Visit Summary was printed and given to the patient.  FOLLOW UP: Return in about 5 days (around 04/28/2017) for f/u HTN/DM.  Signed:  Santiago Bumpers, MD           04/23/2017

## 2017-04-28 ENCOUNTER — Ambulatory Visit (INDEPENDENT_AMBULATORY_CARE_PROVIDER_SITE_OTHER): Payer: Managed Care, Other (non HMO) | Admitting: Family Medicine

## 2017-04-28 ENCOUNTER — Encounter: Payer: Self-pay | Admitting: Family Medicine

## 2017-04-28 VITALS — BP 172/76 | HR 65 | Temp 97.9°F | Resp 16 | Ht 66.5 in | Wt 182.5 lb

## 2017-04-28 DIAGNOSIS — I10 Essential (primary) hypertension: Secondary | ICD-10-CM

## 2017-04-28 DIAGNOSIS — E119 Type 2 diabetes mellitus without complications: Secondary | ICD-10-CM

## 2017-04-28 LAB — MICROALBUMIN / CREATININE URINE RATIO
CREATININE, U: 224.9 mg/dL
MICROALB UR: 9.7 mg/dL — AB (ref 0.0–1.9)
MICROALB/CREAT RATIO: 4.3 mg/g (ref 0.0–30.0)

## 2017-04-28 MED ORDER — SPIRONOLACTONE 50 MG PO TABS: 50.0000 mg | ORAL_TABLET | Freq: Every day | ORAL | 0 refills | Status: DC

## 2017-04-28 MED ORDER — AMLODIPINE BESYLATE 10 MG PO TABS: 10.0000 mg | ORAL_TABLET | Freq: Every day | ORAL | 0 refills | Status: DC

## 2017-04-28 MED ORDER — METOPROLOL TARTRATE 50 MG PO TABS: 50.0000 mg | ORAL_TABLET | Freq: Two times a day (BID) | ORAL | 0 refills | Status: DC

## 2017-04-28 NOTE — Progress Notes (Signed)
OFFICE VISIT  04/28/2017   CC:  Chief Complaint  Patient presents with  . Follow-up    HTN and DM   HPI:    Patient is a 51 y.o.  male who presents for 5 d f/u uncontroled HTN (due to med noncompliance) and DM 2 which was dx'd about 2 yrs ago and pt never followed up about this. Last visit we got him back on his amlodipine, aldactone, and lopressor.  Unfortunately, pharmacy filled lopressor as  qd instead of bid.    HTN: no home bp monitoring.  No HA's on top of head.   Past Medical History:  Diagnosis Date  . DM2 (diabetes mellitus, type 2) (HCC) 2016  . Hypertension     Past Surgical History:  Procedure Laterality Date  . Renal artery dopplers  10/2014   NORMAL    No outpatient prescriptions prior to visit.   No facility-administered medications prior to visit.     Allergies  Allergen Reactions  . Chloroquine Phosphate     REACTION: Itching  . Influenza Vaccines Itching and Swelling    ROS As per HPI  PE: Blood pressure (!) 172/76, pulse 65, temperature 97.9 F (36.6 C), temperature source Oral, resp. rate 16, height 5' 6.5" (1.689 m), weight 182 lb 8 oz (82.8 kg), SpO2 100 %. Gen: Alert, well appearing.  Patient is oriented to person, place, time, and situation. AFFECT: pleasant, lucid thought and speech. CV: RRR, soft syst murmur at aortic valve area.  No r/g.   LUNGS: CTA bilat, nonlabored resps, good aeration in all lung fields. EXT: no clubbing, cyanosis, or edema.   LABS:    Chemistry      Component Value Date/Time   NA 140 04/23/2017 1347   K 3.8 04/23/2017 1347   CL 99 04/23/2017 1347   CO2 37 (H) 04/23/2017 1347   BUN 15 04/23/2017 1347   CREATININE 1.11 04/23/2017 1347      Component Value Date/Time   CALCIUM 10.2 04/23/2017 1347   ALKPHOS 92 04/23/2017 1347   AST 17 04/23/2017 1347   ALT 17 04/23/2017 1347   BILITOT 0.4 04/23/2017 1347     Lab Results  Component Value Date   HGBA1C 6.0 04/23/2017   Lab Results  Component  Value Date   WBC 5.1 03/30/2013   HGB 14.5 03/30/2013   HCT 42.6 03/30/2013   MCV 91.4 03/30/2013   PLT 231.0 03/30/2013   Lab Results  Component Value Date   TSH 1.03 03/30/2013    IMPRESSION AND PLAN:  1) HTN:  Change sig on lopressor to 50 mg bid instead of qd. Increase aldactone to  bid. Instructions: Buy a bp monitor for home use at any pharmacy: check blood pressure and heart rate twice daily and bring these numbers with you to next follow up visit in 1 week.  Check BMET at that time.   2) DM 2: f/u A1c last week was good: 6.0%. Continue to work on diet and continue good exercise regimen. Urine microalb/cr today. Feet exam next f/u visit. Pt reminded of eye exam needed.  An After Visit Summary was printed and given to the patient.  FOLLOW UP: Return in about 1 week (around 05/05/2017).  Signed:  Santiago Bumpers, MD           04/28/2017

## 2017-04-28 NOTE — Patient Instructions (Signed)
Buy a bp monitor for home use at any pharmacy: check blood pressure and heart rate twice daily and bring these numbers with you to next follow up visit in 1 week.

## 2017-05-02 ENCOUNTER — Telehealth: Payer: Self-pay | Admitting: Family Medicine

## 2017-05-02 NOTE — Telephone Encounter (Signed)
[facility] Day - Client TELEPHONE ADVICE RECORD [facility]  Patient Name: Devon Walsh  DOB: July 09, 1966    Initial Comment Caller states he's having a bad headache.   Nurse Assessment  Nurse: Earlene Plater, RN, Lupita Leash Date/Time Lamount Cohen Time): 05/02/2017 12:05:31 PM  Confirm and document reason for call. If symptomatic, describe symptoms. ---Caller states he's having a bad headache. He was sent to home and stopped and had 176/95. He is taking blood pressure medication as directed.  Does the patient have any new or worsening symptoms? ---Yes  Will a triage be completed? ---Yes  Related visit to physician within the last 2 weeks? ---No  Does the PT have any chronic conditions? (i.e. diabetes, asthma, etc.) ---Yes  List chronic conditions. ---High blood pressure.  Is this a behavioral health or substance abuse call? ---No     Guidelines    Guideline Title Affirmed Question Affirmed Notes  Headache [1] New headache AND [2] age > 82    Final Disposition User   See Physician within 24 Hours Earlene Plater, RN, Lupita Leash    Comments  Transferred caller to PCP office.  Transferred to PCP office b/c of medication questions.   Referrals  REFERRED TO PCP OFFICE   Caller Disagree/Comply Comply  Caller Understands Yes  PreDisposition Call Doctor

## 2017-05-02 NOTE — Telephone Encounter (Signed)
Pt advised and voiced understanding.   

## 2017-05-02 NOTE — Telephone Encounter (Signed)
SW pt, he stated that he has been taking his BP medications as prescribed. He stated that his BP has been 170's/70's. He stated that he did not take anything for his headache but it has resolved. Pt seems very convinced that his medication is what caused his headache. Please advise. Thanks.

## 2017-05-02 NOTE — Telephone Encounter (Signed)
Pt also LMOM today stating that his headache has eased up but he is concerned that the medications caused his headache. (It was difficult to understand vm left by pt.) He has a f/u apt Monday 05/05/17 at 10:15am. Please advise. Thanks.

## 2017-05-02 NOTE — Telephone Encounter (Signed)
HA likely caused by his ongoing high blood pressure--not due to med. Confirm that he is taking his bp meds as prescribed. Ask for any other bp checks/info in order to get a better idea of bp control since I last saw him (I think I saw him 4 d/a). Did he take any pain med for the headache? Let me know-thx

## 2017-05-02 NOTE — Telephone Encounter (Signed)
Continue taking bp meds as prescribed, take TWO 500 mg tylenol if he gets a headache. Keep f/u appt already set up.-thx

## 2017-05-05 ENCOUNTER — Ambulatory Visit (INDEPENDENT_AMBULATORY_CARE_PROVIDER_SITE_OTHER): Payer: Managed Care, Other (non HMO) | Admitting: Family Medicine

## 2017-05-05 ENCOUNTER — Encounter: Payer: Self-pay | Admitting: Family Medicine

## 2017-05-05 VITALS — BP 147/77 | HR 52 | Temp 98.6°F | Resp 16 | Ht 66.5 in | Wt 183.5 lb

## 2017-05-05 DIAGNOSIS — I10 Essential (primary) hypertension: Secondary | ICD-10-CM | POA: Diagnosis not present

## 2017-05-05 NOTE — Progress Notes (Signed)
OFFICE VISIT  05/05/2017   CC:  Chief Complaint  Patient presents with  . Follow-up    HTN   HPI:    Patient is a 51 y.o.  male who presents for 1 week f/u uncontrolled hypertension. Was exercising today.  HR felt up, BP a little up but nothing like it has been here in the recent past---[place]. He feels like spironolactone may be causing him a HA--top of head.  Taking 50 mg qd at this time. Still running/doing exercise.  He is finding his exercise stamina is improved since getting bp better.  Still planning on buying a home bp cuff.  Feeling well currently (no HA).   Past Medical History:  Diagnosis Date  . DM2 (diabetes mellitus, type 2) (HCC) 2016  . Hypertension     Past Surgical History:  Procedure Laterality Date  . Renal artery dopplers  10/2014   NORMAL    Outpatient Medications Prior to Visit  Medication Sig Dispense Refill  . amLODipine (NORVASC) 10 MG tablet Take 1 tablet (10 mg total) by mouth daily. 30 tablet 0  . metoprolol tartrate (LOPRESSOR) 50 MG tablet Take 1 tablet (50 mg total) by mouth 2 (two) times daily. 60 tablet 0  . spironolactone (ALDACTONE) 50 MG tablet Take 1 tablet (50 mg total) by mouth daily. 60 tablet 0   No facility-administered medications prior to visit.     Allergies  Allergen Reactions  . Chloroquine Phosphate     REACTION: Itching  . Influenza Vaccines Itching and Swelling    ROS As per HPI  PE: Blood pressure (!) 147/77, pulse (!) 52, temperature 98.6 F (37 C), temperature source Oral, resp. rate 16, height 5' 6.5" (1.689 m), weight 183 lb 8 oz (83.2 kg), SpO2 100 %. Gen: Alert, well appearing.  Patient is oriented to person, place, time, and situation. CV: RRR, no m/r/g.   LUNGS: CTA bilat, nonlabored resps, good aeration in all lung fields. EXT: no clubbing, cyanosis, or edema.    LABS:    Chemistry      Component Value Date/Time   NA 140 04/23/2017 1347   K 3.8 04/23/2017 1347   CL  99 04/23/2017 1347   CO2 37 (H) 04/23/2017 1347   BUN 15 04/23/2017 1347   CREATININE 1.11 04/23/2017 1347      Component Value Date/Time   CALCIUM 10.2 04/23/2017 1347   ALKPHOS 92 04/23/2017 1347   AST 17 04/23/2017 1347   ALT 17 04/23/2017 1347   BILITOT 0.4 04/23/2017 1347       IMPRESSION AND PLAN:  Uncontrolled HTN: Much improved. Unclear whether he is having side effect of HA from spironolactone. Plan at this time is to stay on ALL meds as he is currently taking them (aldactone  ONCE daily, amlodipine  qd, and lopressor  bid. Check BMET at next f/u in 2 weeks.  An After Visit Summary was printed and given to the patient.  FOLLOW UP: Return in about 2 weeks (around 05/19/2017) for f/u uncontrolled HTN.  Signed:  Santiago Bumpers, MD           05/05/2017

## 2017-05-19 ENCOUNTER — Ambulatory Visit (INDEPENDENT_AMBULATORY_CARE_PROVIDER_SITE_OTHER): Payer: Managed Care, Other (non HMO) | Admitting: Family Medicine

## 2017-05-19 ENCOUNTER — Encounter: Payer: Self-pay | Admitting: Family Medicine

## 2017-05-19 VITALS — BP 131/76 | HR 57 | Temp 98.3°F | Resp 16 | Ht 66.5 in | Wt 186.5 lb

## 2017-05-19 DIAGNOSIS — I10 Essential (primary) hypertension: Secondary | ICD-10-CM

## 2017-05-19 MED ORDER — AMLODIPINE BESYLATE 10 MG PO TABS: 10.0000 mg | ORAL_TABLET | Freq: Every day | ORAL | 4 refills | Status: DC

## 2017-05-19 MED ORDER — METOPROLOL TARTRATE 50 MG PO TABS: 50.0000 mg | ORAL_TABLET | Freq: Two times a day (BID) | ORAL | 4 refills | Status: DC

## 2017-05-19 MED ORDER — SPIRONOLACTONE 50 MG PO TABS: 50.0000 mg | ORAL_TABLET | Freq: Every day | ORAL | 4 refills | Status: DC

## 2017-05-19 NOTE — Progress Notes (Signed)
OFFICE VISIT  05/19/2017   CC:  Chief Complaint  Patient presents with  . Follow-up    HTN     HPI:    Patient is a 51 y.o.  male who presents for f/u uncontrolled HTN. Home bp monitoring:  bp avg 130s/80s since I last saw him. Still some occ mild HA and this goes away after short while. Taking meds as rx'd.   Past Medical History:  Diagnosis Date  . DM2 (diabetes mellitus, type 2) (HCC) 2016  . Hypertension     Past Surgical History:  Procedure Laterality Date  . Renal artery dopplers  10/2014   NORMAL    Outpatient Medications Prior to Visit  Medication Sig Dispense Refill  . amLODipine (NORVASC) 10 MG tablet Take 1 tablet (10 mg total) by mouth daily. 30 tablet 0  . metoprolol tartrate (LOPRESSOR) 50 MG tablet Take 1 tablet (50 mg total) by mouth 2 (two) times daily. 60 tablet 0  . spironolactone (ALDACTONE) 50 MG tablet Take 1 tablet (50 mg total) by mouth daily. 60 tablet 0   No facility-administered medications prior to visit.     Allergies  Allergen Reactions  . Chloroquine Phosphate     REACTION: Itching  . Influenza Vaccines Itching and Swelling    ROS As per HPI  PE: Blood pressure 131/76, pulse (!) 57, temperature 98.3 F (36.8 C), temperature source Oral, resp. rate 16, height 5' 6.5" (1.689 m), weight 186 lb 8 oz (84.6 kg), SpO2 98 %. Gen: Alert, well appearing.  Patient is oriented to person, place, time, and situation. AFFECT: pleasant, lucid thought and speech. CV: RRR, no m/r/g.   LUNGS: CTA bilat, nonlabored resps, good aeration in all lung fields. EXT: no clubbing, cyanosis, or edema.    LABS:    Chemistry      Component Value Date/Time   NA 140 04/23/2017 1347   K 3.8 04/23/2017 1347   CL 99 04/23/2017 1347   CO2 37 (H) 04/23/2017 1347   BUN 15 04/23/2017 1347   CREATININE 1.11 04/23/2017 1347      Component Value Date/Time   CALCIUM 10.2 04/23/2017 1347   ALKPHOS 92 04/23/2017 1347   AST 17 04/23/2017 1347   ALT 17  04/23/2017 1347   BILITOT 0.4 04/23/2017 1347      Lab Results  Component Value Date   HGBA1C 6.0 04/23/2017    IMPRESSION AND PLAN:  1) HTN: good control now. Emphasized that pt remain compliant with meds, diet, exercise. Will check BMET today.  An After Visit Summary was printed and given to the patient.  FOLLOW UP: Return in about 4 months (around 09/19/2017) for routine chronic illness f/u.  Signed:  Santiago Bumpers, MD           05/19/2017

## 2017-05-20 LAB — BASIC METABOLIC PANEL
BUN: 17 mg/dL (ref 7–25)
CALCIUM: 9.7 mg/dL (ref 8.6–10.3)
CHLORIDE: 101 mmol/L (ref 98–110)
CO2: 30 mmol/L (ref 20–32)
Creat: 1.2 mg/dL (ref 0.60–1.35)
GLUCOSE: 151 mg/dL — AB (ref 65–99)
Potassium: 4 mmol/L (ref 3.5–5.3)
SODIUM: 140 mmol/L (ref 135–146)

## 2017-09-19 ENCOUNTER — Ambulatory Visit: Payer: Managed Care, Other (non HMO) | Admitting: Family Medicine

## 2017-09-23 ENCOUNTER — Ambulatory Visit (INDEPENDENT_AMBULATORY_CARE_PROVIDER_SITE_OTHER): Payer: Managed Care, Other (non HMO) | Admitting: Family Medicine

## 2017-09-23 ENCOUNTER — Encounter: Payer: Self-pay | Admitting: Family Medicine

## 2017-09-23 VITALS — BP 139/89 | HR 53 | Temp 97.9°F | Wt 189.0 lb

## 2017-09-23 DIAGNOSIS — Z13220 Encounter for screening for lipoid disorders: Secondary | ICD-10-CM

## 2017-09-23 DIAGNOSIS — I10 Essential (primary) hypertension: Secondary | ICD-10-CM | POA: Diagnosis not present

## 2017-09-23 DIAGNOSIS — E119 Type 2 diabetes mellitus without complications: Secondary | ICD-10-CM | POA: Diagnosis not present

## 2017-09-23 MED ORDER — AMLODIPINE BESYLATE 10 MG PO TABS: 10.0000 mg | ORAL_TABLET | Freq: Every day | ORAL | 6 refills | Status: DC

## 2017-09-23 MED ORDER — SPIRONOLACTONE 100 MG PO TABS: 100.0000 mg | ORAL_TABLET | Freq: Every day | ORAL | 1 refills | Status: DC

## 2017-09-23 MED ORDER — METOPROLOL TARTRATE 50 MG PO TABS: 50.0000 mg | ORAL_TABLET | Freq: Two times a day (BID) | ORAL | 6 refills | Status: DC

## 2017-09-23 NOTE — Progress Notes (Signed)
OFFICE VISIT  09/23/2017   CC:  Chief Complaint  Patient presents with  . Follow-up    RCI   HPI:    Patient is a 52 y.o.  male who presents for 4 mo f/u HTN, DM 2.  Home bps: 140-150/70s/70s-80s. He has been compliant with all meds.  Feet: no burning, tingling, or pain in feet. No home glucose monitoring.  No polyuria or polydispia.  No home gluc checking. Trying to eat relatively low carb diet but admits he is not always strict with this.  ROS: no HAs, no dizziness, no CP, no SOB, no LE swelling.  No palpitations.  No generalized weakness.  Past Medical History:  Diagnosis Date  . DM2 (diabetes mellitus, type 2) (HCC) 2016  . Hypertension     Past Surgical History:  Procedure Laterality Date  . Renal artery dopplers  10/2014   NORMAL    Outpatient Medications Prior to Visit  Medication Sig Dispense Refill  . amLODipine (NORVASC) 10 MG tablet Take 1 tablet (10 mg total) by mouth daily. 30 tablet 4  . metoprolol tartrate (LOPRESSOR) 50 MG tablet Take 1 tablet (50 mg total) by mouth 2 (two) times daily. 60 tablet 4  . spironolactone (ALDACTONE) 50 MG tablet Take 1 tablet (50 mg total) by mouth daily. 30 tablet 4   No facility-administered medications prior to visit.     Allergies  Allergen Reactions  . Chloroquine Phosphate     REACTION: Itching  . Influenza Vaccines Itching and Swelling    ROS As per HPI  PE: Blood pressure 139/89, pulse (!) 53, temperature 97.9 F (36.6 C), temperature source Oral, weight 189 lb (85.7 kg), SpO2 100 %. Body mass index is 30.05 kg/m. Gen: Alert, well appearing.  Patient is oriented to person, place, time, and situation. AFFECT: pleasant, lucid thought and speech.  Foot exam - bilateral normal; no swelling, tenderness or skin or vascular lesions. Color and temperature is normal. Sensation is intact. Peripheral pulses are palpable. Toenails are normal.   LABS:  Lab Results  Component Value Date   TSH 1.03 03/30/2013    Lab Results  Component Value Date   WBC 5.1 03/30/2013   HGB 14.5 03/30/2013   HCT 42.6 03/30/2013   MCV 91.4 03/30/2013   PLT 231.0 03/30/2013   Lab Results  Component Value Date   CREATININE 1.20 05/19/2017   BUN 17 05/19/2017   NA 140 05/19/2017   K 4.0 05/19/2017   CL 101 05/19/2017   CO2 30 05/19/2017   Lab Results  Component Value Date   ALT 17 04/23/2017   AST 17 04/23/2017   ALKPHOS 92 04/23/2017   BILITOT 0.4 04/23/2017   Lab Results  Component Value Date   CHOL 138 12/05/2014   Lab Results  Component Value Date   HDL 39.40 12/05/2014   Lab Results  Component Value Date   LDLCALC 87 12/05/2014   Lab Results  Component Value Date   TRIG 58.0 12/05/2014   Lab Results  Component Value Date   CHOLHDL 4 12/05/2014   Lab Results  Component Value Date   HGBA1C 6.0 04/23/2017    IMPRESSION AND PLAN:  1) Uncontrolled HTN: increase aldactone to 100 mg qd. BMET today.  2) DM 2, diet controlled.  HbA1c today. Feet exam normal today. Will screen for hypercholesterolemia today--he is fasting.  An After Visit Summary was printed and given to the patient.  FOLLOW UP: Return in about 4 weeks (around 10/21/2017) for  f/u HTN.  Signed:  Santiago BumpersPhil McGowen, MD           09/23/2017

## 2017-09-24 ENCOUNTER — Other Ambulatory Visit: Payer: Self-pay | Admitting: *Deleted

## 2017-09-24 LAB — BASIC METABOLIC PANEL
BUN: 15 mg/dL (ref 7–25)
CHLORIDE: 101 mmol/L (ref 98–110)
CO2: 30 mmol/L (ref 20–32)
Calcium: 9.4 mg/dL (ref 8.6–10.3)
Creat: 1.08 mg/dL (ref 0.60–1.35)
Glucose, Bld: 98 mg/dL (ref 65–99)
POTASSIUM: 4.2 mmol/L (ref 3.5–5.3)
Sodium: 137 mmol/L (ref 135–146)

## 2017-09-24 LAB — LIPID PANEL
CHOLESTEROL: 168 mg/dL (ref <200)
HDL: 42 mg/dL (ref >40)
LDL Cholesterol (Calc): 107 mg/dL (calc) — ABNORMAL HIGH
Non-HDL Cholesterol (Calc): 126 mg/dL (calc) (ref <130)
Total CHOL/HDL Ratio: 4 (calc) (ref <5.0)
Triglycerides: 92 mg/dL (ref <150)

## 2017-09-24 LAB — HEMOGLOBIN A1C
EAG (MMOL/L): 8.5 (calc)
HEMOGLOBIN A1C: 7 %{Hb} — AB (ref <5.7)
MEAN PLASMA GLUCOSE: 154 (calc)

## 2017-09-24 MED ORDER — METFORMIN HCL 500 MG PO TABS: 500.0000 mg | ORAL_TABLET | Freq: Two times a day (BID) | ORAL | 3 refills | Status: DC

## 2017-10-21 ENCOUNTER — Ambulatory Visit: Payer: Managed Care, Other (non HMO) | Admitting: Family Medicine

## 2017-10-23 ENCOUNTER — Encounter: Payer: Self-pay | Admitting: Family Medicine

## 2017-10-23 ENCOUNTER — Ambulatory Visit: Payer: Managed Care, Other (non HMO) | Admitting: Family Medicine

## 2017-10-23 VITALS — BP 126/81 | HR 68 | Resp 16 | Ht 66.5 in | Wt 186.0 lb

## 2017-10-23 DIAGNOSIS — Z298 Encounter for other specified prophylactic measures: Secondary | ICD-10-CM

## 2017-10-23 DIAGNOSIS — E119 Type 2 diabetes mellitus without complications: Secondary | ICD-10-CM | POA: Diagnosis not present

## 2017-10-23 DIAGNOSIS — I10 Essential (primary) hypertension: Secondary | ICD-10-CM

## 2017-10-23 MED ORDER — ATOVAQUONE-PROGUANIL HCL 250-100 MG PO TABS: ORAL_TABLET | ORAL | 0 refills | Status: DC

## 2017-10-23 NOTE — Progress Notes (Signed)
OFFICE VISIT  10/23/2017   CC:  Chief Complaint  Patient presents with  . Follow-up    HTN   HPI:    Patient is a 52 y.o. African-American male who presents for 1 mo f/u uncontrolled HTN. Last visit I increased his aldactone from 50mg  qd to 100 mg qd. Home bp's consistently 120s/70s.   Also, at last visit his A1c was up to 7% so I started him on metformin 500 mg bid. Denies any side effect from this med.  Has no acute complaints. He feels much much better compared to last time he was here.  Past Medical History:  Diagnosis Date  . DM2 (diabetes mellitus, type 2) (HCC) 2016  . Hypertension   . Malaria 2018    Past Surgical History:  Procedure Laterality Date  . Renal artery dopplers  10/2014   NORMAL    Outpatient Medications Prior to Visit  Medication Sig Dispense Refill  . amLODipine (NORVASC) 10 MG tablet Take 1 tablet (10 mg total) by mouth daily. 30 tablet 6  . metFORMIN (GLUCOPHAGE) 500 MG tablet Take 1 tablet (500 mg total) by mouth 2 (two) times daily with a meal. 60 tablet 3  . metoprolol tartrate (LOPRESSOR) 50 MG tablet Take 1 tablet (50 mg total) by mouth 2 (two) times daily. 60 tablet 6  . spironolactone (ALDACTONE) 100 MG tablet Take 1 tablet (100 mg total) by mouth daily. 30 tablet 1   No facility-administered medications prior to visit.     Allergies  Allergen Reactions  . Chloroquine Phosphate     REACTION: Itching  . Influenza Vaccines Itching and Swelling    ROS As per HPI  PE: Blood pressure 126/81, pulse 68, resp. rate 16, height 5' 6.5" (1.689 m), weight 186 lb (84.4 kg), SpO2 100 %. Gen: Alert, well appearing.  Patient is oriented to person, place, time, and situation. AFFECT: pleasant, lucid thought and speech. No further exam today.  LABS:    Chemistry      Component Value Date/Time   NA 137 09/23/2017 1143   K 4.2 09/23/2017 1143   CL 101 09/23/2017 1143   CO2 30 09/23/2017 1143   BUN 15 09/23/2017 1143   CREATININE 1.08  09/23/2017 1143      Component Value Date/Time   CALCIUM 9.4 09/23/2017 1143   ALKPHOS 92 04/23/2017 1347   AST 17 04/23/2017 1347   ALT 17 04/23/2017 1347   BILITOT 0.4 04/23/2017 1347     Lab Results  Component Value Date   CHOL 168 09/23/2017   HDL 42 09/23/2017   LDLCALC 107 (H) 09/23/2017   TRIG 92 09/23/2017   CHOLHDL 4.0 09/23/2017   Lab Results  Component Value Date   HGBA1C 7.0 (H) 09/23/2017    IMPRESSION AND PLAN:  1) HTN: The current medical regimen is effective;  continue present plan and medications. Recheck BMET today.  2) Hx of malaria.  He'll be traveling to [place] this weekend. I'll rx malaria prophylaxis today: atovaquone-proguanil 250-100, 1 qd --appropriate instructions on rx today.  3) DM 2: tolerating metformin x 1 mo now.   Recheck A1c in 2 mo.  An After Visit Summary was printed and given to the patient.  FOLLOW UP: Return in about 2 months (around 12/23/2017) for routine chronic illness f/u.  Signed:  Santiago BumpersPhil Sharmila Wrobleski, MD           10/23/2017

## 2017-10-24 LAB — EXTRA LAV TOP TUBE

## 2017-10-24 LAB — BASIC METABOLIC PANEL
BUN: 23 mg/dL (ref 7–25)
CALCIUM: 9.8 mg/dL (ref 8.6–10.3)
CHLORIDE: 106 mmol/L (ref 98–110)
CO2: 27 mmol/L (ref 20–32)
Creat: 1.31 mg/dL (ref 0.60–1.35)
GLUCOSE: 93 mg/dL (ref 65–99)
Potassium: 5 mmol/L (ref 3.5–5.3)
Sodium: 140 mmol/L (ref 135–146)

## 2017-12-10 ENCOUNTER — Other Ambulatory Visit: Payer: Self-pay | Admitting: Family Medicine

## 2017-12-31 ENCOUNTER — Encounter: Payer: Self-pay | Admitting: Family Medicine

## 2017-12-31 ENCOUNTER — Ambulatory Visit: Payer: Managed Care, Other (non HMO) | Admitting: Family Medicine

## 2017-12-31 NOTE — Progress Notes (Deleted)
OFFICE VISIT  12/31/2017   CC: No chief complaint on file.  HPI:    Patient is a 52 y.o.  male who presents for 3 mo f/u DM 2 and HTN. At last visit I started him on metformin 500 mg bid b/c his A1c went up to 7%. Need to discuss starting statin now given its CV benefits in people with DM (regardless of their cholesterol).  Past Medical History:  Diagnosis Date  . DM2 (diabetes mellitus, type 2) (HCC) 2016  . Hypertension   . Malaria 2018    Past Surgical History:  Procedure Laterality Date  . Renal artery dopplers  10/2014   NORMAL    Outpatient Medications Prior to Visit  Medication Sig Dispense Refill  . amLODipine (NORVASC) 10 MG tablet Take 1 tablet (10 mg total) by mouth daily. 30 tablet 6  . atovaquone-proguanil (MALARONE) 250-100 MG TABS tablet 1 tab po qd: start 1 day prior to travel, continue to take qd until 7d after return home. 38 tablet 0  . metFORMIN (GLUCOPHAGE) 500 MG tablet Take 1 tablet (500 mg total) by mouth 2 (two) times daily with a meal. 60 tablet 3  . metoprolol tartrate (LOPRESSOR) 50 MG tablet Take 1 tablet (50 mg total) by mouth 2 (two) times daily. 60 tablet 6  . spironolactone (ALDACTONE) 100 MG tablet TAKE ONE TABLET BY MOUTH ONE TIME DAILY  30 tablet 0   No facility-administered medications prior to visit.     Allergies  Allergen Reactions  . Chloroquine Phosphate     REACTION: Itching  . Influenza Vaccines Itching and Swelling    ROS As per HPI  PE: There were no vitals taken for this visit. ***  LABS:  Lab Results  Component Value Date   TSH 1.03 03/30/2013   Lab Results  Component Value Date   WBC 5.1 03/30/2013   HGB 14.5 03/30/2013   HCT 42.6 03/30/2013   MCV 91.4 03/30/2013   PLT 231.0 03/30/2013   Lab Results  Component Value Date   CREATININE 1.31 10/23/2017   BUN 23 10/23/2017   NA 140 10/23/2017   K 5.0 10/23/2017   CL 106 10/23/2017   CO2 27 10/23/2017   Lab Results  Component Value Date   ALT 17  04/23/2017   AST 17 04/23/2017   ALKPHOS 92 04/23/2017   BILITOT 0.4 04/23/2017   Lab Results  Component Value Date   CHOL 168 09/23/2017   Lab Results  Component Value Date   HDL 42 09/23/2017   Lab Results  Component Value Date   LDLCALC 107 (H) 09/23/2017   Lab Results  Component Value Date   TRIG 92 09/23/2017   Lab Results  Component Value Date   CHOLHDL 4.0 09/23/2017   Lab Results  Component Value Date   HGBA1C 7.0 (H) 09/23/2017    IMPRESSION AND PLAN:  No problem-specific Assessment & Plan notes found for this encounter.   FOLLOW UP: No follow-ups on file.

## 2018-09-11 ENCOUNTER — Ambulatory Visit: Payer: Managed Care, Other (non HMO) | Admitting: Family Medicine

## 2018-09-11 DIAGNOSIS — Z0289 Encounter for other administrative examinations: Secondary | ICD-10-CM

## 2018-09-11 NOTE — Progress Notes (Deleted)
OFFICE VISIT  09/11/2018   CC: No chief complaint on file.    HPI:    Patient is a 53 y.o.  male who presents for discussion of preventative care for travel to [place] planned for ***. He has DM 2 and HTN and has not followed up in about 1 yr. Last time I saw him his bp was under control and he was tolerating metformin that I had started him on 1 mo earlier. I recommended 2 mo f/u DM and HTN but he did not do this follow up.  Interim Hx:   Past Medical History:  Diagnosis Date  . DM2 (diabetes mellitus, type 2) (HCC) 2016  . Hypertension   . Malaria 2018    Past Surgical History:  Procedure Laterality Date  . Renal artery dopplers  10/2014   NORMAL    Outpatient Medications Prior to Visit  Medication Sig Dispense Refill  . amLODipine (NORVASC) 10 MG tablet Take 1 tablet (10 mg total) by mouth daily. 30 tablet 6  . atovaquone-proguanil (MALARONE) 250-100 MG TABS tablet 1 tab po qd: start 1 day prior to travel, continue to take qd until 7d after return home. 38 tablet 0  . metFORMIN (GLUCOPHAGE) 500 MG tablet Take 1 tablet (500 mg total) by mouth 2 (two) times daily with a meal. 60 tablet 3  . metoprolol tartrate (LOPRESSOR) 50 MG tablet Take 1 tablet (50 mg total) by mouth 2 (two) times daily. 60 tablet 6  . spironolactone (ALDACTONE) 100 MG tablet TAKE ONE TABLET BY MOUTH ONE TIME DAILY  30 tablet 0   No facility-administered medications prior to visit.     Allergies  Allergen Reactions  . Chloroquine Phosphate     REACTION: Itching  . Influenza Vaccines Itching and Swelling    ROS As per HPI  PE: There were no vitals taken for this visit. ***  LABS:    Chemistry      Component Value Date/Time   NA 140 10/23/2017 1410   K 5.0 10/23/2017 1410   CL 106 10/23/2017 1410   CO2 27 10/23/2017 1410   BUN 23 10/23/2017 1410   CREATININE 1.31 10/23/2017 1410      Component Value Date/Time   CALCIUM 9.8 10/23/2017 1410   ALKPHOS 92 04/23/2017 1347   AST 17  04/23/2017 1347   ALT 17 04/23/2017 1347   BILITOT 0.4 04/23/2017 1347     Lab Results  Component Value Date   HGBA1C 7.0 (H) 09/23/2017   Lab Results  Component Value Date   TSH 1.03 03/30/2013   Lab Results  Component Value Date   CHOL 168 09/23/2017   HDL 42 09/23/2017   LDLCALC 107 (H) 09/23/2017   TRIG 92 09/23/2017   CHOLHDL 4.0 09/23/2017   Lab Results  Component Value Date   WBC 5.1 03/30/2013   HGB 14.5 03/30/2013   HCT 42.6 03/30/2013   MCV 91.4 03/30/2013   PLT 231.0 03/30/2013     IMPRESSION AND PLAN:  No problem-specific Assessment & Plan notes found for this encounter.   An After Visit Summary was printed and given to the patient.  FOLLOW UP: No follow-ups on file.  Signed:  Santiago Bumpers, MD           09/11/2018

## 2018-09-18 ENCOUNTER — Ambulatory Visit: Payer: 59 | Admitting: Family Medicine

## 2018-09-18 ENCOUNTER — Encounter: Payer: Self-pay | Admitting: Family Medicine

## 2018-09-18 VITALS — BP 152/89 | HR 57 | Temp 97.8°F | Resp 16 | Ht 66.5 in | Wt 193.2 lb

## 2018-09-18 DIAGNOSIS — I10 Essential (primary) hypertension: Secondary | ICD-10-CM | POA: Diagnosis not present

## 2018-09-18 DIAGNOSIS — Z298 Encounter for other specified prophylactic measures: Secondary | ICD-10-CM

## 2018-09-18 DIAGNOSIS — E119 Type 2 diabetes mellitus without complications: Secondary | ICD-10-CM

## 2018-09-18 DIAGNOSIS — K4090 Unilateral inguinal hernia, without obstruction or gangrene, not specified as recurrent: Secondary | ICD-10-CM | POA: Diagnosis not present

## 2018-09-18 MED ORDER — METOPROLOL TARTRATE 50 MG PO TABS: 50.0000 mg | ORAL_TABLET | Freq: Two times a day (BID) | ORAL | 2 refills | Status: DC

## 2018-09-18 MED ORDER — METFORMIN HCL 500 MG PO TABS: 500.0000 mg | ORAL_TABLET | Freq: Two times a day (BID) | ORAL | 2 refills | Status: DC

## 2018-09-18 MED ORDER — AMLODIPINE BESYLATE 10 MG PO TABS: 10.0000 mg | ORAL_TABLET | Freq: Every day | ORAL | 2 refills | Status: DC

## 2018-09-18 MED ORDER — SPIRONOLACTONE 100 MG PO TABS: 100.0000 mg | ORAL_TABLET | Freq: Every day | ORAL | 2 refills | Status: DC

## 2018-09-18 NOTE — Progress Notes (Signed)
OFFICE VISIT  09/18/2018   CC:  Chief Complaint  Patient presents with  . Rx for Malaria  Pt is not fasting.  HPI:    Patient is a 53 y.o. African male who presents for discussion of travel medicine and f/u of DM 2 and HTN. He plans on leaving for a trip to [place] on 09/21/2018.  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 I have rx'd him malarone for malaria prophylaxis in the past.  He realized today that he has plenty of this med left to take for this trip.  His last visit with me was 10/23/2017, at which time he had been on metformin for 1 mo w/out problem. His bp was controlled. Lytes/cr stable at that time.  He has prekese that he drinks as a tea 1-2 times per day. Stopped taking  He is taking metformin 500 mg bid and metoprolol 50 mg bid. Not eating low carb/low fat diet.  Last 4-5 d eating a high sodium diet.  He is out of amlodipine and aldactone. Home bp measurements have been 130/70s average.  + Polyuria and polydipsia and blurry vision occurring unclear amount of time/duration. Has noted a bulge in L inguinal region for "at least a year or two".  No pain in the area.  It goes in when he pushes on it softly. Nothing in scrotum.  Past Medical History:  Diagnosis Date  . DM2 (diabetes mellitus, type 2) (HCC) 2016  . Hypertension   . Malaria 2018    Past Surgical History:  Procedure Laterality Date  . Renal artery dopplers  10/2014   NORMAL    Outpatient Medications Prior to Visit  Medication Sig Dispense Refill  . atovaquone-proguanil (MALARONE) 250-100 MG TABS tablet 1 tab po qd: start 1 day prior to travel, continue to take qd until 7d after return home. 38 tablet 0  . amLODipine (NORVASC) 10 MG tablet Take 1 tablet (10 mg total) by mouth daily. 30 tablet 6  . metFORMIN (GLUCOPHAGE) 500 MG tablet Take 1 tablet (500 mg total) by mouth 2 (two) times daily with a meal. 60 tablet 3  . metoprolol tartrate (LOPRESSOR) 50 MG tablet Take 1 tablet (50 mg total) by mouth  2 (two) times daily. 60 tablet 6  . spironolactone (ALDACTONE) 100 MG tablet TAKE ONE TABLET BY MOUTH ONE TIME DAILY  (Patient not taking: Reported on 09/18/2018) 30 tablet 0   No facility-administered medications prior to visit.     Allergies  Allergen Reactions  . Chloroquine Phosphate     REACTION: Itching  . Influenza Vaccines Itching and Swelling    ROS As per HPI  PE: Blood pressure (!) 152/89, pulse (!) 57, temperature 97.8 F (36.6 C), temperature source Oral, resp. rate 16, height 5' 6.5" (1.689 m), weight 193 lb 3.2 oz (87.6 kg), SpO2 99 %. Gen: Alert, well appearing.  Patient is oriented to person, place, time, and situation. AFFECT: pleasant, lucid thought and speech. CV: RRR, no m/r/g.   LUNGS: CTA bilat, nonlabored resps, good aeration in all lung fields. EXT: no clubbing or cyanosis.  no edema.  Abd: soft, NT/ND.  GU: left inguinal protrusion of soft tissue, easily reducible with gentle pressure.  No tenderness.  No scrotal mass.  LABS:  Lab Results  Component Value Date   TSH 1.03 03/30/2013   Lab Results  Component Value Date   WBC 5.1 03/30/2013   HGB 14.5 03/30/2013   HCT 42.6 03/30/2013   MCV 91.4 03/30/2013  PLT 231.0 03/30/2013   Lab Results  Component Value Date   CREATININE 1.31 10/23/2017   BUN 23 10/23/2017   NA 140 10/23/2017   K 5.0 10/23/2017   CL 106 10/23/2017   CO2 27 10/23/2017   Lab Results  Component Value Date   ALT 17 04/23/2017   AST 17 04/23/2017   ALKPHOS 92 04/23/2017   BILITOT 0.4 04/23/2017   Lab Results  Component Value Date   CHOL 168 09/23/2017   Lab Results  Component Value Date   HDL 42 09/23/2017   Lab Results  Component Value Date   LDLCALC 107 (H) 09/23/2017   Lab Results  Component Value Date   TRIG 92 09/23/2017   Lab Results  Component Value Date   CHOLHDL 4.0 09/23/2017   Lab Results  Component Value Date   HGBA1C 7.0 (H) 09/23/2017    IMPRESSION AND PLAN:  1) DM: question full  compliance with metformin (I last rx'd this for pt many months ago and he did not have enough RF's for it to last this long).  No home gluc monitoring. At any rate, will keep him on this med at current dose for now, check A1c.  He did give hx of sx's c/w hyperglycemia (see HPI). CMET and A1c.  2) HTN: unclear control.  As usual, with [place] it is tough to tell the accuracy of his history. I did not tell him anything one way or the other about his use of the prekese. Restart amlodipine and aldactone, continue metoprolol. CMET today.  3) Inguinal hernia: asymptomatic.  Watchful waiting approach. Signs/symptoms to call or return for were reviewed and pt expressed understanding.  4) Travel medicine: going to [place] soon. He has malarone that is not expired: plenty to get him through this trip and also to take for 1 week after return.  An After Visit Summary was printed and given to the patient.  FOLLOW UP: Return in about 3 months (around 12/17/2018) for routine chronic illness f/u.  Signed:  Santiago Bumpers, MD           09/18/2018

## 2018-09-19 LAB — COMPREHENSIVE METABOLIC PANEL
AG Ratio: 1.4 (calc) (ref 1.0–2.5)
ALKALINE PHOSPHATASE (APISO): 91 U/L (ref 35–144)
ALT: 15 U/L (ref 9–46)
AST: 18 U/L (ref 10–35)
Albumin: 4.2 g/dL (ref 3.6–5.1)
BUN: 19 mg/dL (ref 7–25)
CALCIUM: 9.3 mg/dL (ref 8.6–10.3)
CO2: 30 mmol/L (ref 20–32)
CREATININE: 1.13 mg/dL (ref 0.70–1.33)
Chloride: 103 mmol/L (ref 98–110)
GLOBULIN: 2.9 g/dL (ref 1.9–3.7)
Glucose, Bld: 106 mg/dL — ABNORMAL HIGH (ref 65–99)
Potassium: 3.8 mmol/L (ref 3.5–5.3)
Sodium: 141 mmol/L (ref 135–146)
Total Bilirubin: 0.5 mg/dL (ref 0.2–1.2)
Total Protein: 7.1 g/dL (ref 6.1–8.1)

## 2018-09-19 LAB — HEMOGLOBIN A1C
Hgb A1c MFr Bld: 7.4 % of total Hgb — ABNORMAL HIGH (ref <5.7)
Mean Plasma Glucose: 166 (calc)
eAG (mmol/L): 9.2 (calc)

## 2018-09-21 ENCOUNTER — Other Ambulatory Visit: Payer: Self-pay | Admitting: *Deleted

## 2018-09-21 MED ORDER — METFORMIN HCL 1000 MG PO TABS: 1000.0000 mg | ORAL_TABLET | Freq: Two times a day (BID) | ORAL | 3 refills | Status: DC

## 2019-06-21 ENCOUNTER — Telehealth: Payer: Self-pay | Admitting: Family Medicine

## 2019-06-21 MED ORDER — AMLODIPINE BESYLATE 10 MG PO TABS: 10.0000 mg | ORAL_TABLET | Freq: Every day | ORAL | 2 refills | Status: DC

## 2019-06-21 MED ORDER — METOPROLOL TARTRATE 50 MG PO TABS: 50.0000 mg | ORAL_TABLET | Freq: Two times a day (BID) | ORAL | 2 refills | Status: DC

## 2019-06-21 NOTE — Telephone Encounter (Signed)
FYI  Please see below

## 2019-06-21 NOTE — Telephone Encounter (Signed)
Patient requested several times for appt today or tomorrow with PCP. He was advised nothing was available until Thursday. He will be scheduled for 1pm.

## 2019-06-21 NOTE — Telephone Encounter (Signed)
Please advise, thanks.

## 2019-06-21 NOTE — Telephone Encounter (Signed)
Back/abdominal pain, no appetite/early satiety, no fever. Patient is requesting an appt

## 2019-06-21 NOTE — Telephone Encounter (Signed)
Called patient and he stated that he took his bp 20 minutes ago. He has dry mouth and lips. He also has a headache. Patient has not had any medication due to being out of the country and not being able to get back until October because of [place]. Patient is wanting his medication sent in. Patient does not want to go to the ER. I advised him that he really needs to go. Please advise, thank you

## 2019-06-21 NOTE — Telephone Encounter (Signed)
Noted  

## 2019-06-21 NOTE — Telephone Encounter (Signed)
LM for pt to returncall

## 2019-06-21 NOTE — Telephone Encounter (Signed)
I just sent his meds in--I sent a note to [facility] b/c she had sent me one earlier.

## 2019-06-21 NOTE — Telephone Encounter (Signed)
Patient went to urgent care. His bp was 178/108. There is a language barrier so I'm not 100% positive of the number. Patient said the urgent care said to contact his PCP for bp meds. Patient said if he doesn't start on the meds soon he is afraid he is "going to collapse".

## 2019-06-21 NOTE — Telephone Encounter (Signed)
Patient took blood pressure at [facility] 178/108.  He is requesting meds to be called at [facility].  Please call patient [phone].  He said he is going to call back every 30 minutes  He said this was an emergency

## 2019-06-21 NOTE — Telephone Encounter (Signed)
Patient walked in. Patient felt like his blood pressure might up. Advised patient to go to urgent care.

## 2019-06-21 NOTE — Telephone Encounter (Signed)
OK, I sent his 2 blood pressure meds to [facility]. Needs office f/u 7-10d, but go to ED if bp doesn't come down when he gets back on meds, esp if he continues to have HA.

## 2019-06-22 NOTE — Telephone Encounter (Signed)
I would just like to clarify before calling patient that he should f/u in 7-10 days, he is scheduled for appt tomorrow with you for abd pain.  Please advise, thanks.

## 2019-06-22 NOTE — Telephone Encounter (Signed)
Patient advised and voiced understanding.  

## 2019-06-22 NOTE — Telephone Encounter (Signed)
Will combine this telephone message with other message that has PCP recommendations.

## 2019-06-22 NOTE — Telephone Encounter (Signed)
No additional f/u appt needed as long as I see him tomorrow as scheduled.-thx

## 2019-06-24 ENCOUNTER — Encounter: Payer: Self-pay | Admitting: Family Medicine

## 2019-06-24 ENCOUNTER — Other Ambulatory Visit: Payer: Self-pay

## 2019-06-24 ENCOUNTER — Ambulatory Visit (INDEPENDENT_AMBULATORY_CARE_PROVIDER_SITE_OTHER): Payer: Managed Care, Other (non HMO) | Admitting: Family Medicine

## 2019-06-24 VITALS — BP 133/82 | HR 57 | Temp 98.2°F | Resp 16 | Ht 66.5 in | Wt 175.8 lb

## 2019-06-24 DIAGNOSIS — E119 Type 2 diabetes mellitus without complications: Secondary | ICD-10-CM

## 2019-06-24 DIAGNOSIS — R634 Abnormal weight loss: Secondary | ICD-10-CM | POA: Diagnosis not present

## 2019-06-24 DIAGNOSIS — I10 Essential (primary) hypertension: Secondary | ICD-10-CM | POA: Diagnosis not present

## 2019-06-24 NOTE — Progress Notes (Signed)
OFFICE VISIT  06/24/2019   CC:  Chief Complaint  Patient presents with  . Abdominal Pain  . Back Pain   HPI:    Patient is a 53 y.o. male who presents for pain in back and abdomen, decreased appetite. He has DM 2, HTN, and HLD. I have not seen him for 9 mo or so.  He traveled home to [place] in spring 2020 and got stuck there due to global pandemic.  Just got back about 3 wks ago.  DM: A1c was up to 7% Feb 2019 so I started him on metformin.  A1c up to 7.4% feb 2020->metformin inc to 1000 bid.  HTN: He called 3 d/a with c/o feeling like bp was up, bp 170's/100s at [facility] that day->he had been off his bp meds for at least a month b/c had been out of country and had not followed up with me for 8 mo or so. I sent in his amlodipine and metoprolol but not his aldactone. He had been having some LBP, decreased appetite, stomach a bit upset-->for approx 2 weeks, ever since getting back from [place] 06/07/19.  He had felt well while in [place].  He had been out of bp meds and metformin since 11/2018. He feels improved.  BP coming down 125/90 yesterday--this was after he finished working for the day. No back pain, abd pain, or appetite problems now. His wt is down, has been in [place] and says he ate much less starchy foods and increased veggies.  Review of Systems  Constitutional: Negative for fatigue and fever.  HENT: Negative for congestion and sore throat.   Eyes: Negative for visual disturbance.  Respiratory: Negative for cough.   Cardiovascular: Negative for chest pain.  Gastrointestinal: Negative for abdominal pain and nausea.  Genitourinary: Negative for dysuria.  Musculoskeletal: Negative for back pain and joint swelling.  Skin: Negative for rash.  Neurological: Negative for weakness and headaches.  Hematological: Negative for adenopathy.    Past Medical History:  Diagnosis Date  . DM2 (diabetes mellitus, type 2) (HCC) 2016  . Hypertension   . Left inguinal hernia    asymptomatic  .  Malaria 2018   Past Surgical History:  Procedure Laterality Date  . Renal artery dopplers  10/2014   NORMAL   Outpatient Medications Prior to Visit  Medication Sig Dispense Refill  . amLODipine (NORVASC) 10 MG tablet Take 1 tablet (10 mg total) by mouth daily. 30 tablet 2  . metFORMIN (GLUCOPHAGE) 1000 MG tablet Take 1 tablet (1,000 mg total) by mouth 2 (two) times daily with a meal. 60 tablet 3  . metoprolol tartrate (LOPRESSOR) 50 MG tablet Take 1 tablet (50 mg total) by mouth 2 (two) times daily. 60 tablet 2  . spironolactone (ALDACTONE) 100 MG tablet Take 1 tablet (100 mg total) by mouth daily. 30 tablet 2  . atovaquone-proguanil (MALARONE) 250-100 MG TABS tablet 1 tab po qd: start 1 day prior to travel, continue to take qd until 7d after return home. (Patient not taking: Reported on 06/24/2019) 38 tablet 0   No facility-administered medications prior to visit.     Allergies  Allergen Reactions  . Chloroquine Phosphate     REACTION: Itching  . Influenza Vaccines Itching and Swelling    ROS As per HPI  PE: Blood pressure 133/82, pulse (!) 57, temperature 98.2 F (36.8 C), temperature source Temporal, resp. rate 16, height 5' 6.5" (1.689 m), weight 175 lb 12.8 oz (79.7 kg), SpO2 99 %. Body mass  index is 27.95 kg/m.  Gen: Alert, well appearing.  Patient is oriented to person, place, time, and situation. AFFECT: pleasant, lucid thought and speech. CV: RRR, no m/r/g.   LUNGS: CTA bilat, nonlabored resps, good aeration in all lung fields. ABD: soft, NT, ND, BS normal.  No hepatospenomegaly or mass.  No bruits. EXT: no clubbing or cyanosis.  no edema.    LABS:  Lab Results  Component Value Date   TSH 1.03 03/30/2013   Lab Results  Component Value Date   WBC 5.1 03/30/2013   HGB 14.5 03/30/2013   HCT 42.6 03/30/2013   MCV 91.4 03/30/2013   PLT 231.0 03/30/2013   Lab Results  Component Value Date   CREATININE 1.13 09/18/2018   BUN 19 09/18/2018   NA 141 09/18/2018    K 3.8 09/18/2018   CL 103 09/18/2018   CO2 30 09/18/2018   Lab Results  Component Value Date   ALT 15 09/18/2018   AST 18 09/18/2018   ALKPHOS 92 04/23/2017   BILITOT 0.5 09/18/2018   Lab Results  Component Value Date   CHOL 168 09/23/2017   Lab Results  Component Value Date   HDL 42 09/23/2017   Lab Results  Component Value Date   LDLCALC 107 (H) 09/23/2017   Lab Results  Component Value Date   TRIG 92 09/23/2017   Lab Results  Component Value Date   CHOLHDL 4.0 09/23/2017   Lab Results  Component Value Date   HGBA1C 7.4 (H) 09/18/2018    IMPRESSION AND PLAN:  1) HTN: now back under control since getting back on metoprolol and amlodipine. We'll leave the aldactone off at this time. Continue to monitor bp outside of the office, call if persistently >140/90. CMET today. All his symptoms he was having when bp was up are gone.  2) DM 2, w/out complication. Noncompliant with meds the last 7 mo. Wt loss 17 lbs since Feb 2020. Check A1c today, restart metformin if A1c 7% or greater.  3) Weight loss: suspect this was due to significant improvement in diet since I last saw him PLUS recent poor appetite when bp out of control.   CBC, CMET today.  An After Visit Summary was printed and given to the patient.  FOLLOW UP: No follow-ups on file.  Signed:  Crissie Sickles, MD           06/24/2019

## 2019-06-25 LAB — COMPREHENSIVE METABOLIC PANEL
AG Ratio: 1.1 (calc) (ref 1.0–2.5)
ALT: 21 U/L (ref 9–46)
AST: 24 U/L (ref 10–35)
Albumin: 3.8 g/dL (ref 3.6–5.1)
Alkaline phosphatase (APISO): 82 U/L (ref 35–144)
BUN: 17 mg/dL (ref 7–25)
CO2: 29 mmol/L (ref 20–32)
Calcium: 9.5 mg/dL (ref 8.6–10.3)
Chloride: 102 mmol/L (ref 98–110)
Creat: 1.33 mg/dL (ref 0.70–1.33)
Globulin: 3.4 g/dL (calc) (ref 1.9–3.7)
Glucose, Bld: 149 mg/dL — ABNORMAL HIGH (ref 65–99)
Potassium: 4.4 mmol/L (ref 3.5–5.3)
Sodium: 140 mmol/L (ref 135–146)
Total Bilirubin: 0.6 mg/dL (ref 0.2–1.2)
Total Protein: 7.2 g/dL (ref 6.1–8.1)

## 2019-06-25 LAB — CBC WITH DIFFERENTIAL/PLATELET
Absolute Monocytes: 263 cells/uL (ref 200–950)
Basophils Absolute: 11 cells/uL (ref 0–200)
Basophils Relative: 0.3 %
Eosinophils Absolute: 0 cells/uL — ABNORMAL LOW (ref 15–500)
Eosinophils Relative: 0 %
HCT: 44.2 % (ref 38.5–50.0)
Hemoglobin: 14.5 g/dL (ref 13.2–17.1)
Lymphs Abs: 1084 cells/uL (ref 850–3900)
MCH: 30 pg (ref 27.0–33.0)
MCHC: 32.8 g/dL (ref 32.0–36.0)
MCV: 91.5 fL (ref 80.0–100.0)
MPV: 11.3 fL (ref 7.5–12.5)
Monocytes Relative: 7.3 %
Neutro Abs: 2243 cells/uL (ref 1500–7800)
Neutrophils Relative %: 62.3 %
Platelets: 290 10*3/uL (ref 140–400)
RBC: 4.83 10*6/uL (ref 4.20–5.80)
RDW: 12.1 % (ref 11.0–15.0)
Total Lymphocyte: 30.1 %
WBC: 3.6 10*3/uL — ABNORMAL LOW (ref 3.8–10.8)

## 2019-06-25 LAB — HEMOGLOBIN A1C
Hgb A1c MFr Bld: 7.5 % of total Hgb — ABNORMAL HIGH (ref <5.7)
Mean Plasma Glucose: 169 (calc)
eAG (mmol/L): 9.3 (calc)

## 2019-06-30 ENCOUNTER — Other Ambulatory Visit: Payer: Self-pay

## 2019-06-30 MED ORDER — METFORMIN HCL 500 MG PO TABS: 500.0000 mg | ORAL_TABLET | Freq: Two times a day (BID) | ORAL | 3 refills | Status: DC

## 2019-09-24 ENCOUNTER — Encounter: Payer: Managed Care, Other (non HMO) | Admitting: Family Medicine

## 2019-10-18 ENCOUNTER — Other Ambulatory Visit: Payer: Self-pay

## 2019-10-18 ENCOUNTER — Encounter: Payer: Self-pay | Admitting: Family Medicine

## 2019-10-18 ENCOUNTER — Ambulatory Visit (INDEPENDENT_AMBULATORY_CARE_PROVIDER_SITE_OTHER): Payer: 59 | Admitting: Family Medicine

## 2019-10-18 VITALS — BP 150/93 | HR 62 | Temp 98.4°F | Resp 16 | Ht 66.5 in | Wt 188.0 lb

## 2019-10-18 DIAGNOSIS — Z1211 Encounter for screening for malignant neoplasm of colon: Secondary | ICD-10-CM | POA: Diagnosis not present

## 2019-10-18 DIAGNOSIS — Z23 Encounter for immunization: Secondary | ICD-10-CM

## 2019-10-18 DIAGNOSIS — Z298 Encounter for other specified prophylactic measures: Secondary | ICD-10-CM

## 2019-10-18 DIAGNOSIS — Z125 Encounter for screening for malignant neoplasm of prostate: Secondary | ICD-10-CM | POA: Diagnosis not present

## 2019-10-18 DIAGNOSIS — I10 Essential (primary) hypertension: Secondary | ICD-10-CM

## 2019-10-18 DIAGNOSIS — E663 Overweight: Secondary | ICD-10-CM

## 2019-10-18 DIAGNOSIS — E119 Type 2 diabetes mellitus without complications: Secondary | ICD-10-CM | POA: Diagnosis not present

## 2019-10-18 DIAGNOSIS — Z0000 Encounter for general adult medical examination without abnormal findings: Secondary | ICD-10-CM | POA: Diagnosis not present

## 2019-10-18 LAB — BASIC METABOLIC PANEL
BUN: 15 mg/dL (ref 6–23)
CO2: 25 mEq/L (ref 19–32)
Calcium: 9.7 mg/dL (ref 8.4–10.5)
Chloride: 100 mEq/L (ref 96–112)
Creatinine, Ser: 1.13 mg/dL (ref 0.40–1.50)
GFR: 82.63 mL/min (ref >60.00)
Glucose, Bld: 143 mg/dL — ABNORMAL HIGH (ref 70–99)
Potassium: 3.8 mEq/L (ref 3.5–5.1)
Sodium: 139 mEq/L (ref 135–145)

## 2019-10-18 LAB — MICROALBUMIN / CREATININE URINE RATIO
Creatinine,U: 95.8 mg/dL
Microalb Creat Ratio: 5.1 mg/g (ref 0.0–30.0)
Microalb, Ur: 4.9 mg/dL — ABNORMAL HIGH (ref 0.0–1.9)

## 2019-10-18 LAB — LIPID PANEL
Cholesterol: 160 mg/dL (ref 0–200)
HDL: 42 mg/dL (ref >39.00)
LDL Cholesterol: 101 mg/dL — ABNORMAL HIGH (ref 0–99)
NonHDL: 117.58
Total CHOL/HDL Ratio: 4
Triglycerides: 84 mg/dL (ref 0.0–149.0)
VLDL: 16.8 mg/dL (ref 0.0–40.0)

## 2019-10-18 LAB — HEMOGLOBIN A1C: Hgb A1c MFr Bld: 7.6 % — ABNORMAL HIGH (ref 4.6–6.5)

## 2019-10-18 LAB — TSH: TSH: 1.77 u[IU]/mL (ref 0.35–4.50)

## 2019-10-18 LAB — PSA: PSA: 2.6 ng/mL (ref 0.10–4.00)

## 2019-10-18 MED ORDER — ATOVAQUONE-PROGUANIL HCL 250-100 MG PO TABS: ORAL_TABLET | ORAL | 0 refills | Status: AC

## 2019-10-18 NOTE — Addendum Note (Signed)
Addended by: Emi Holes D on: 10/18/2019 09:45 AM   Modules accepted: Orders

## 2019-10-18 NOTE — Progress Notes (Signed)
CC: wellness check / fu T2DM, HTN  HPI: Vaccines Screen for colon cancer, PSA,  PMH:  T2DM, HTN, L inguinal hernia (2016), malaria (2018)  M/A: amLODipine (NORVASC) 10 MG table - 1 tablet (10 mg total) by mouth daily metFORMIN (GLUCOPHAGE) 500 MG tablet - 1 tablet (500 mg total) by mouth 2 (two) times daily with a meal metoprolol tartrate (LOPRESSOR) 50 MG tablet - Take 1 tablet (50 mg total) by mouth 2 (two) times daily. Allergies .Chloroquine Phosphate  REACTION: Itching .Influenza Vaccines Itching and Swelling PSH: renal artery dopplers (normal; march 2016)  FH: htn  SH: Married, 1 little girl. Orig from [place]. Works at [employer]. Never smoker, occ alcohol, no drugs. +Exercises. PE  CV : RRR, normal S1 S2 , no m/r/g, no carotid bruits , 2+ pulses b/l , no edema CHEST : CTAB, normal respiratory expansion, no CVA tenderness  Abd: no HSM, normoactive bowel sounds  HEENT: normocephalic, atraumatic, no LAD, PERRLA, trachea midline w/o deviations

## 2019-10-18 NOTE — Progress Notes (Signed)
Office Note 10/18/2019  CC:  Chief Complaint  Patient presents with  . Annual Exam    pt is fasting   HPI:  Devon Walsh is a 54 y.o.  male who is here for annual health maintenance exam and for f/u DM 2 and HTN. Going to [place] stoon, statying 2 wks, needs hydroxychloroquine.  DM: not monitoring home glucoses. HTN: check bp at pharmacy--highest 115/85, best one is 125/75.    Wt is up some, doing quite a bit of exercise, says he feels like most wt gain is muscle mass. His diet is fairly good.  Still eating lots of rice, adding lots of fresh fruits though.  Past Medical History:  Diagnosis Date  . DM2 (diabetes mellitus, type 2) (HCC) 2016  . Hypertension   . Left inguinal hernia    asymptomatic  . Malaria 2018    Past Surgical History:  Procedure Laterality Date  . Renal artery dopplers  10/2014   NORMAL    Family History  Problem Relation Age of Onset  . Hypertension Unknown     Social History   Socioeconomic History  . Marital status: Single    Spouse name: Not on file  . Number of children: Not on file  . Years of education: Not on file  . Highest education level: Not on file  Occupational History  . Not on file  Tobacco Use  . Smoking status: Never Smoker  . Smokeless tobacco: Never Used  Substance and Sexual Activity  . Alcohol use: No  . Drug use: No  . Sexual activity: Not on file  Other Topics Concern  . Not on file  Social History Narrative   Married, 1 little girl.   Orig from [place].   Works at [employer].   Never smoker, occ alcohol, no drugs.   +Exercises.   Social Determinants of Health   Financial Resource Strain:   . Difficulty of Paying Living Expenses:   Food Insecurity:   . Worried About Programme researcher, broadcasting/film/video in the Last Year:   . Barista in the Last Year:   Transportation Needs:   . Freight forwarder (Medical):   Marland Kitchen Lack of Transportation (Non-Medical):   Physical Activity:   . Days of Exercise per Week:    . Minutes of Exercise per Session:   Stress:   . Feeling of Stress :   Social Connections:   . Frequency of Communication with Friends and Family:   . Frequency of Social Gatherings with Friends and Family:   . Attends Religious Services:   . Active Member of Clubs or Organizations:   . Attends Banker Meetings:   Marland Kitchen Marital Status:   Intimate Partner Violence:   . Fear of Current or Ex-Partner:   . Emotionally Abused:   Marland Kitchen Physically Abused:   . Sexually Abused:     Outpatient Medications Prior to Visit  Medication Sig Dispense Refill  . amLODipine (NORVASC) 10 MG tablet Take 1 tablet (10 mg total) by mouth daily. 30 tablet 2  . metFORMIN (GLUCOPHAGE) 500 MG tablet Take 1 tablet (500 mg total) by mouth 2 (two) times daily with a meal. 60 tablet 3  . metoprolol tartrate (LOPRESSOR) 50 MG tablet Take 1 tablet (50 mg total) by mouth 2 (two) times daily. 60 tablet 2   No facility-administered medications prior to visit.    Allergies  Allergen Reactions  . Chloroquine Phosphate     REACTION: Itching  .  Influenza Vaccines Itching and Swelling    ROS Review of Systems  Constitutional: Negative for appetite change, chills, fatigue and fever.  HENT: Negative for congestion, dental problem, ear pain and sore throat.   Eyes: Negative for discharge, redness and visual disturbance.  Respiratory: Negative for cough, chest tightness, shortness of breath and wheezing.   Cardiovascular: Negative for chest pain, palpitations and leg swelling.  Gastrointestinal: Negative for abdominal pain, blood in stool, diarrhea, nausea and vomiting.  Genitourinary: Negative for difficulty urinating, dysuria, flank pain, frequency, hematuria and urgency.  Musculoskeletal: Negative for arthralgias, back pain, joint swelling, myalgias and neck stiffness.  Skin: Negative for pallor and rash.  Neurological: Negative for dizziness, speech difficulty, weakness and headaches.  Hematological:  Negative for adenopathy. Does not bruise/bleed easily.  Psychiatric/Behavioral: Negative for confusion and sleep disturbance. The patient is not nervous/anxious.     PE; Blood pressure (!) 150/93, pulse 62, temperature 98.4 F (36.9 C), temperature source Temporal, resp. rate 16, height 5' 6.5" (1.689 m), weight 188 lb (85.3 kg), SpO2 99 %. Body mass index is 29.89 kg/m.  Gen: Alert, well appearing.  Patient is oriented to person, place, time, and situation. AFFECT: pleasant, lucid thought and speech. ENT: Ears: EACs clear, normal epithelium.  TMs with good light reflex and landmarks bilaterally.  Eyes: no injection, icteris, swelling, or exudate.  EOMI, PERRLA. Nose: no drainage or turbinate edema/swelling.  No injection or focal lesion.  Mouth: lips without lesion/swelling.  Oral mucosa pink and moist.  Dentition intact and without obvious caries or gingival swelling.  Oropharynx without erythema, exudate, or swelling.  Neck: supple/nontender.  No LAD, mass, or TM.  Carotid pulses 2+ bilaterally, without bruits. CV: RRR, no m/r/g.   LUNGS: CTA bilat, nonlabored resps, good aeration in all lung fields. ABD: soft, NT, ND, BS normal.  No hepatospenomegaly or mass.  No bruits. EXT: no clubbing, cyanosis, or edema.  Musculoskeletal: no joint swelling, erythema, warmth, or tenderness.  ROM of all joints intact. Skin - no sores or suspicious lesions or rashes or color changes Rectal exam: pt deferred this.   Pertinent labs:  Lab Results  Component Value Date   TSH 1.03 03/30/2013   Lab Results  Component Value Date   WBC 3.6 (L) 06/24/2019   HGB 14.5 06/24/2019   HCT 44.2 06/24/2019   MCV 91.5 06/24/2019   PLT 290 06/24/2019   Lab Results  Component Value Date   CREATININE 1.33 06/24/2019   BUN 17 06/24/2019   NA 140 06/24/2019   K 4.4 06/24/2019   CL 102 06/24/2019   CO2 29 06/24/2019   Lab Results  Component Value Date   ALT 21 06/24/2019   AST 24 06/24/2019   ALKPHOS 92  04/23/2017   BILITOT 0.6 06/24/2019   Lab Results  Component Value Date   CHOL 168 09/23/2017   Lab Results  Component Value Date   HDL 42 09/23/2017   Lab Results  Component Value Date   LDLCALC 107 (H) 09/23/2017   Lab Results  Component Value Date   TRIG 92 09/23/2017   Lab Results  Component Value Date   CHOLHDL 4.0 09/23/2017   Lab Results  Component Value Date   HGBA1C 7.5 (H) 06/24/2019    ASSESSMENT AND PLAN:   1) DM 2: not monitoring glucoses. Continue metformin. Hba1c and urine microalb/cr today. Lytes/cr today as well as FLP.  Hepatic panel normal 4 mo ago.  2) HTN: Good control on amlod and lopressor--didn't take  meds today. Lytes/cr today, TSH as well.  3) Health maintenance exam: Reviewed age and gender appropriate health maintenance issues (prudent diet, regular exercise, health risks of tobacco and excessive alcohol, use of seatbelts, fire alarms in home, use of sunscreen).  Also reviewed age and gender appropriate health screening as well as vaccine recommendations. Vaccines: Tdap->given today.  Pneumovax->given today. Labs: BMET, FLP, TSH, A1c, PSA (DM, HTN, prost ca scr) Prostate ca screening: DRE-->he wants to defer this today, PSA. Colon ca screening: due for initial screening: -->he wants to defer this for now.  4) Need for malaria prophylaxis--emphasized use of mosquito repellant, rx'd proguanil today. Therapeutic expectations and side effect profile of medication discussed today.  Patient's questions answered.  An After Visit Summary was printed and given to the patient.  FOLLOW UP:  Return in about 6 months (around 04/19/2020).  Signed:  Santiago Bumpers, MD           10/18/2019

## 2019-10-19 ENCOUNTER — Other Ambulatory Visit: Payer: Self-pay

## 2019-10-19 MED ORDER — METFORMIN HCL 1000 MG PO TABS: 1000.0000 mg | ORAL_TABLET | Freq: Two times a day (BID) | ORAL | 6 refills | Status: DC

## 2019-11-03 ENCOUNTER — Other Ambulatory Visit: Payer: Self-pay | Admitting: Family Medicine

## 2020-01-11 ENCOUNTER — Other Ambulatory Visit: Payer: Self-pay | Admitting: Family Medicine

## 2020-04-17 ENCOUNTER — Ambulatory Visit: Payer: 59 | Admitting: Family Medicine

## 2020-04-17 NOTE — Progress Notes (Deleted)
OFFICE VISIT  04/17/2020  CC: No chief complaint on file.   HPI:    Patient is a 54 y.o.  male who presents for 6 mo f/u DM 2, HTN, and overweight.   Past Medical History:  Diagnosis Date  . DM2 (diabetes mellitus, type 2) (HCC) 2016  . Hypertension   . Left inguinal hernia    asymptomatic  . Malaria 2018    Past Surgical History:  Procedure Laterality Date  . Renal artery dopplers  10/2014   NORMAL    Outpatient Medications Prior to Visit  Medication Sig Dispense Refill  . amLODipine (NORVASC) 10 MG tablet TAKE ONE TABLET BY MOUTH ONE TIME DAILY  90 tablet 1  . atovaquone-proguanil (MALARONE) 250-100 MG TABS tablet 1 tab po qd: start 1 day prior to travel, continue to take qd until 7d after return home. 25 tablet 0  . metFORMIN (GLUCOPHAGE) 1000 MG tablet Take 1 tablet (1,000 mg total) by mouth 2 (two) times daily with a meal. 60 tablet 6  . metoprolol tartrate (LOPRESSOR) 50 MG tablet TAKE ONE TABLET BY MOUTH TWICE DAILY  180 tablet 0   No facility-administered medications prior to visit.    Allergies  Allergen Reactions  . Chloroquine Phosphate     REACTION: Itching  . Influenza Vaccines Itching and Swelling    ROS As per HPI  PE: Vitals with BMI 10/18/2019 06/24/2019 09/18/2018  Height 5' 6.5" 5' 6.5" 5' 6.5"  Weight 188 lbs 175 lbs 13 oz 193 lbs 3 oz  BMI 29.89 27.95 30.72  Systolic 150 133 701  Diastolic 93 82 89  Pulse 62 57 57     ***  LABS:  Lab Results  Component Value Date   TSH 1.77 10/18/2019   Lab Results  Component Value Date   WBC 3.6 (L) 06/24/2019   HGB 14.5 06/24/2019   HCT 44.2 06/24/2019   MCV 91.5 06/24/2019   PLT 290 06/24/2019   Lab Results  Component Value Date   CREATININE 1.13 10/18/2019   BUN 15 10/18/2019   NA 139 10/18/2019   K 3.8 10/18/2019   CL 100 10/18/2019   CO2 25 10/18/2019   Lab Results  Component Value Date   ALT 21 06/24/2019   AST 24 06/24/2019   ALKPHOS 92 04/23/2017   BILITOT 0.6 06/24/2019    Lab Results  Component Value Date   CHOL 160 10/18/2019   Lab Results  Component Value Date   HDL 42.00 10/18/2019   Lab Results  Component Value Date   LDLCALC 101 (H) 10/18/2019   Lab Results  Component Value Date   TRIG 84.0 10/18/2019   Lab Results  Component Value Date   CHOLHDL 4 10/18/2019   Lab Results  Component Value Date   PSA 2.60 10/18/2019   Lab Results  Component Value Date   HGBA1C 7.6 (H) 10/18/2019    IMPRESSION AND PLAN:  No problem-specific Assessment & Plan notes found for this encounter.   An After Visit Summary was printed and given to the patient.  FOLLOW UP: No follow-ups on file.  @esig @

## 2020-04-24 ENCOUNTER — Ambulatory Visit: Payer: 59 | Admitting: Family Medicine

## 2020-05-08 ENCOUNTER — Other Ambulatory Visit: Payer: Self-pay | Admitting: Family Medicine

## 2020-05-08 ENCOUNTER — Encounter: Payer: Self-pay | Admitting: Family Medicine

## 2020-05-08 ENCOUNTER — Other Ambulatory Visit: Payer: Self-pay

## 2020-05-08 ENCOUNTER — Ambulatory Visit (INDEPENDENT_AMBULATORY_CARE_PROVIDER_SITE_OTHER): Payer: 59 | Admitting: Family Medicine

## 2020-05-08 VITALS — BP 148/76 | HR 60 | Temp 97.9°F | Resp 16 | Ht 66.5 in | Wt 188.2 lb

## 2020-05-08 DIAGNOSIS — I10 Essential (primary) hypertension: Secondary | ICD-10-CM | POA: Diagnosis not present

## 2020-05-08 DIAGNOSIS — E119 Type 2 diabetes mellitus without complications: Secondary | ICD-10-CM

## 2020-05-08 LAB — BASIC METABOLIC PANEL
BUN: 13 mg/dL (ref 6–23)
CO2: 30 mEq/L (ref 19–32)
Calcium: 9.2 mg/dL (ref 8.4–10.5)
Chloride: 99 mEq/L (ref 96–112)
Creatinine, Ser: 1.06 mg/dL (ref 0.40–1.50)
GFR: 88.76 mL/min (ref >60.00)
Glucose, Bld: 387 mg/dL — ABNORMAL HIGH (ref 70–99)
Potassium: 3.5 mEq/L (ref 3.5–5.1)
Sodium: 137 mEq/L (ref 135–145)

## 2020-05-08 LAB — HEMOGLOBIN A1C: Hgb A1c MFr Bld: 8.9 % — ABNORMAL HIGH (ref 4.6–6.5)

## 2020-05-08 MED ORDER — METFORMIN HCL 1000 MG PO TABS: 1000.0000 mg | ORAL_TABLET | Freq: Two times a day (BID) | ORAL | 3 refills | Status: AC

## 2020-05-08 MED ORDER — VICTOZA 18 MG/3ML ~~LOC~~ SOPN: PEN_INJECTOR | SUBCUTANEOUS | 0 refills | Status: AC

## 2020-05-08 NOTE — Progress Notes (Signed)
OFFICE VISIT  05/08/2020  CC:  Chief Complaint  Patient presents with  . Follow-up    [facility], pt is not fasting    HPI:    Patient is a 54 y.o.  male who presents for 6 mo f/u DM and HTN. A/P as of last visit: "1) DM 2: not monitoring glucoses. Continue metformin. Hba1c and urine microalb/cr today. Lytes/cr today as well as FLP.  Hepatic panel normal 4 mo ago.  2) HTN: Good control on amlod and lopressor--didn't take meds today. Lytes/cr today, TSH as well.  3) Health maintenance exam: Reviewed age and gender appropriate health maintenance issues (prudent diet, regular exercise, health risks of tobacco and excessive alcohol, use of seatbelts, fire alarms in home, use of sunscreen).  Also reviewed age and gender appropriate health screening as well as vaccine recommendations. Vaccines: Tdap->given today.  Pneumovax->given today. Labs: BMET, FLP, TSH, A1c, PSA (DM, HTN, prost ca scr) Prostate ca screening: DRE-->he wants to defer this today, PSA. Colon ca screening: due for initial screening: -->he wants to defer this for now.  4) Need for malaria prophylaxis--emphasized use of mosquito repellant, rx'd proguanil today. Therapeutic expectations and side effect profile of medication discussed today.  Patient's questions answered."  INTERIM HX:  DM: increased metformin to 1000 mg bid b/c a1c up to 7.6% last visit. Feet: no burning, tingling, or numbness in feet. No hx of ulcers.  HTN: home bp's avg 130 over 70s Home glucose monitoring: none. Has some nocturia x 1 mo, drinks a lot of water.  No excessive thirst, though.  No exercise for the last 3 wks.  ROS: no fevers, no CP, no SOB, no wheezing, no cough, no dizziness, no HAs, no rashes, no melena/hematochezia.  No polyuria or polydipsia.  No myalgias or arthralgias.  No focal weakness, paresthesias, or tremors.  No acute vision or hearing abnormalities. No n/v/d or abd pain.  No palpitations.     Past Medical History:   Diagnosis Date  . DM2 (diabetes mellitus, type 2) (HCC) 2016  . Hypertension   . Left inguinal hernia    asymptomatic  . Malaria 2018    Past Surgical History:  Procedure Laterality Date  . Renal artery dopplers  10/2014   NORMAL    Outpatient Medications Prior to Visit  Medication Sig Dispense Refill  . amLODipine (NORVASC) 10 MG tablet TAKE ONE TABLET BY MOUTH ONE TIME DAILY  90 tablet 1  . metoprolol tartrate (LOPRESSOR) 50 MG tablet TAKE ONE TABLET BY MOUTH TWICE DAILY  180 tablet 0  . metFORMIN (GLUCOPHAGE) 1000 MG tablet Take 1 tablet (1,000 mg total) by mouth 2 (two) times daily with a meal. 60 tablet 6  . atovaquone-proguanil (MALARONE) 250-100 MG TABS tablet 1 tab po qd: start 1 day prior to travel, continue to take qd until 7d after return home. (Patient not taking: Reported on 05/08/2020) 25 tablet 0   No facility-administered medications prior to visit.    Allergies  Allergen Reactions  . Chloroquine Phosphate     REACTION: Itching  . Influenza Vaccines Itching and Swelling    ROS As per HPI  PE: Vitals with BMI 05/08/2020 10/18/2019 06/24/2019  Height 5' 6.5" 5' 6.5" 5' 6.5"  Weight 188 lbs 3 oz 188 lbs 175 lbs 13 oz  BMI 29.92 29.89 27.95  Systolic 148 150 096  Diastolic 76 93 82  Pulse 60 62 57     Gen: Alert, well appearing.  Patient is oriented to person, place,  time, and situation. AFFECT: pleasant, lucid thought and speech. CV: RRR, no m/r/g.   LUNGS: CTA bilat, nonlabored resps, good aeration in all lung fields. Foot exam - bilateral normal; no swelling, tenderness or skin or vascular lesions. Color and temperature is normal. Sensation is intact. Peripheral pulses are palpable. Toenails are normal.   LABS:  Lab Results  Component Value Date   TSH 1.77 10/18/2019   Lab Results  Component Value Date   WBC 3.6 (L) 06/24/2019   HGB 14.5 06/24/2019   HCT 44.2 06/24/2019   MCV 91.5 06/24/2019   PLT 290 06/24/2019   Lab Results  Component  Value Date   CREATININE 1.13 10/18/2019   BUN 15 10/18/2019   NA 139 10/18/2019   K 3.8 10/18/2019   CL 100 10/18/2019   CO2 25 10/18/2019   Lab Results  Component Value Date   ALT 21 06/24/2019   AST 24 06/24/2019   ALKPHOS 92 04/23/2017   BILITOT 0.6 06/24/2019   Lab Results  Component Value Date   CHOL 160 10/18/2019   Lab Results  Component Value Date   HDL 42.00 10/18/2019   Lab Results  Component Value Date   LDLCALC 101 (H) 10/18/2019   Lab Results  Component Value Date   TRIG 84.0 10/18/2019   Lab Results  Component Value Date   CHOLHDL 4 10/18/2019   Lab Results  Component Value Date   PSA 2.60 10/18/2019   Lab Results  Component Value Date   HGBA1C 7.6 (H) 10/18/2019   IMPRESSION AND PLAN:  1) DM.  No home monitoring. Feet exam normal.  Continue metformin 1000 mg bid. Hba1c and lytes/cr today.  2) HTN: avg home bp good. Continue lopressor and amlodipine. Lytes/cr today.  An After Visit Summary was printed and given to the patient.  FOLLOW UP: Return in about 6 months (around 11/06/2020) for annual CPE (fasting).  Signed:  Santiago Bumpers, MD           05/08/2020

## 2020-05-09 NOTE — Progress Notes (Signed)
LVM to CB (lab results)

## 2020-05-17 ENCOUNTER — Other Ambulatory Visit: Payer: Self-pay

## 2020-05-17 MED ORDER — AMLODIPINE BESYLATE 10 MG PO TABS: 10.0000 mg | ORAL_TABLET | Freq: Every day | ORAL | 1 refills | Status: DC

## 2020-06-13 ENCOUNTER — Other Ambulatory Visit: Payer: Self-pay | Admitting: Family Medicine

## 2020-12-26 ENCOUNTER — Other Ambulatory Visit: Payer: Self-pay | Admitting: Family Medicine

## 2024-04-12 ENCOUNTER — Telehealth: Payer: Self-pay

## 2024-04-12 NOTE — Telephone Encounter (Signed)
 Patient was identified as falling into the [place] Measure - Diabetes.   Patient was: Patient is not currently using our practice.

## 2024-04-28 NOTE — Progress Notes (Signed)
 Devon Walsh                                          MRN: [id number]   04/28/2024   The [facility] Quality Team Specialist reviewed this patient medical record for the purposes of chart review for care gap closure. The following were reviewed: chart review for care gap closure-colorectal cancer screening.    [facility] Quality Team
